# Patient Record
Sex: Male | Born: 1960
Health system: Southern US, Community
[De-identification: ages and names within clinical notes are randomized; demographics above are authoritative.]

## PROBLEM LIST (undated history)

## (undated) DIAGNOSIS — E1169 Type 2 diabetes mellitus with other specified complication: Secondary | ICD-10-CM

## (undated) DIAGNOSIS — E119 Type 2 diabetes mellitus without complications: Secondary | ICD-10-CM

## (undated) DIAGNOSIS — E781 Pure hyperglyceridemia: Secondary | ICD-10-CM

## (undated) DIAGNOSIS — M109 Gout, unspecified: Secondary | ICD-10-CM

## (undated) DIAGNOSIS — E785 Hyperlipidemia, unspecified: Secondary | ICD-10-CM

## (undated) DIAGNOSIS — K5790 Diverticulosis of intestine, part unspecified, without perforation or abscess without bleeding: Secondary | ICD-10-CM

## (undated) DIAGNOSIS — I209 Angina pectoris, unspecified: Secondary | ICD-10-CM

## (undated) DIAGNOSIS — I2510 Atherosclerotic heart disease of native coronary artery without angina pectoris: Secondary | ICD-10-CM

## (undated) DIAGNOSIS — Z8719 Personal history of other diseases of the digestive system: Secondary | ICD-10-CM

## (undated) DIAGNOSIS — I70213 Atherosclerosis of native arteries of extremities with intermittent claudication, bilateral legs: Secondary | ICD-10-CM

## (undated) HISTORY — DX: Hyperlipidemia, unspecified: E78.5

## (undated) HISTORY — PX: FOOT FRACTURE SURGERY: SHX645

## (undated) HISTORY — DX: Gout, unspecified: M10.9

## (undated) HISTORY — DX: Type 2 diabetes mellitus with other specified complication: E11.69

## (undated) HISTORY — DX: Atherosclerosis of native arteries of extremities with intermittent claudication, bilateral legs: I70.213

## (undated) HISTORY — DX: Type 2 diabetes mellitus without complications: E11.9

## (undated) HISTORY — DX: Pure hyperglyceridemia: E78.1

## (undated) HISTORY — DX: Diverticulosis of intestine, part unspecified, without perforation or abscess without bleeding: K57.90

## (undated) HISTORY — PX: COLONOSCOPY: SHX174

---

## 2016-08-29 DIAGNOSIS — E119 Type 2 diabetes mellitus without complications: Secondary | ICD-10-CM | POA: Diagnosis not present

## 2016-08-29 DIAGNOSIS — E7800 Pure hypercholesterolemia, unspecified: Secondary | ICD-10-CM | POA: Diagnosis not present

## 2016-08-29 DIAGNOSIS — M654 Radial styloid tenosynovitis [de Quervain]: Secondary | ICD-10-CM | POA: Diagnosis not present

## 2016-10-25 DIAGNOSIS — Z125 Encounter for screening for malignant neoplasm of prostate: Secondary | ICD-10-CM | POA: Diagnosis not present

## 2016-10-25 DIAGNOSIS — Z0000 Encounter for general adult medical examination without abnormal findings: Secondary | ICD-10-CM | POA: Diagnosis not present

## 2016-11-01 DIAGNOSIS — Z23 Encounter for immunization: Secondary | ICD-10-CM | POA: Diagnosis not present

## 2016-11-01 DIAGNOSIS — Z0001 Encounter for general adult medical examination with abnormal findings: Secondary | ICD-10-CM | POA: Diagnosis not present

## 2016-11-07 DIAGNOSIS — N3001 Acute cystitis with hematuria: Secondary | ICD-10-CM | POA: Diagnosis not present

## 2016-11-07 DIAGNOSIS — R310 Gross hematuria: Secondary | ICD-10-CM | POA: Diagnosis not present

## 2016-11-13 DIAGNOSIS — N201 Calculus of ureter: Secondary | ICD-10-CM | POA: Diagnosis not present

## 2016-11-16 DIAGNOSIS — E782 Mixed hyperlipidemia: Secondary | ICD-10-CM | POA: Diagnosis not present

## 2016-11-16 DIAGNOSIS — E119 Type 2 diabetes mellitus without complications: Secondary | ICD-10-CM | POA: Diagnosis not present

## 2016-11-22 DIAGNOSIS — R310 Gross hematuria: Secondary | ICD-10-CM | POA: Diagnosis not present

## 2016-11-24 DIAGNOSIS — N201 Calculus of ureter: Secondary | ICD-10-CM | POA: Diagnosis not present

## 2016-11-27 DIAGNOSIS — N201 Calculus of ureter: Secondary | ICD-10-CM | POA: Diagnosis not present

## 2016-12-06 DIAGNOSIS — N3021 Other chronic cystitis with hematuria: Secondary | ICD-10-CM | POA: Diagnosis not present

## 2016-12-06 DIAGNOSIS — R310 Gross hematuria: Secondary | ICD-10-CM | POA: Diagnosis not present

## 2017-02-20 DIAGNOSIS — L821 Other seborrheic keratosis: Secondary | ICD-10-CM | POA: Diagnosis not present

## 2017-02-20 DIAGNOSIS — L719 Rosacea, unspecified: Secondary | ICD-10-CM | POA: Diagnosis not present

## 2017-02-20 DIAGNOSIS — C4442 Squamous cell carcinoma of skin of scalp and neck: Secondary | ICD-10-CM | POA: Diagnosis not present

## 2017-03-01 DIAGNOSIS — K047 Periapical abscess without sinus: Secondary | ICD-10-CM | POA: Diagnosis not present

## 2017-03-01 DIAGNOSIS — R51 Headache: Secondary | ICD-10-CM | POA: Diagnosis not present

## 2017-11-29 DIAGNOSIS — E119 Type 2 diabetes mellitus without complications: Secondary | ICD-10-CM | POA: Diagnosis not present

## 2017-12-24 DIAGNOSIS — N3021 Other chronic cystitis with hematuria: Secondary | ICD-10-CM | POA: Diagnosis not present

## 2017-12-24 DIAGNOSIS — R310 Gross hematuria: Secondary | ICD-10-CM | POA: Diagnosis not present

## 2018-01-24 DIAGNOSIS — E119 Type 2 diabetes mellitus without complications: Secondary | ICD-10-CM | POA: Diagnosis not present

## 2018-01-24 DIAGNOSIS — Z125 Encounter for screening for malignant neoplasm of prostate: Secondary | ICD-10-CM | POA: Diagnosis not present

## 2018-01-24 DIAGNOSIS — Z0001 Encounter for general adult medical examination with abnormal findings: Secondary | ICD-10-CM | POA: Diagnosis not present

## 2018-01-29 DIAGNOSIS — E875 Hyperkalemia: Secondary | ICD-10-CM | POA: Diagnosis not present

## 2018-01-29 DIAGNOSIS — E8779 Other fluid overload: Secondary | ICD-10-CM | POA: Diagnosis not present

## 2018-01-29 DIAGNOSIS — Z0001 Encounter for general adult medical examination with abnormal findings: Secondary | ICD-10-CM | POA: Diagnosis not present

## 2018-02-05 DIAGNOSIS — E782 Mixed hyperlipidemia: Secondary | ICD-10-CM | POA: Diagnosis not present

## 2018-02-05 DIAGNOSIS — E119 Type 2 diabetes mellitus without complications: Secondary | ICD-10-CM | POA: Diagnosis not present

## 2018-03-07 DIAGNOSIS — L719 Rosacea, unspecified: Secondary | ICD-10-CM | POA: Diagnosis not present

## 2018-03-07 DIAGNOSIS — L820 Inflamed seborrheic keratosis: Secondary | ICD-10-CM | POA: Diagnosis not present

## 2018-03-19 DIAGNOSIS — R8290 Unspecified abnormal findings in urine: Secondary | ICD-10-CM | POA: Diagnosis not present

## 2018-03-19 DIAGNOSIS — R3911 Hesitancy of micturition: Secondary | ICD-10-CM | POA: Diagnosis not present

## 2018-04-10 DIAGNOSIS — E785 Hyperlipidemia, unspecified: Secondary | ICD-10-CM | POA: Diagnosis not present

## 2018-04-10 DIAGNOSIS — E781 Pure hyperglyceridemia: Secondary | ICD-10-CM | POA: Diagnosis not present

## 2018-04-10 DIAGNOSIS — E1169 Type 2 diabetes mellitus with other specified complication: Secondary | ICD-10-CM | POA: Diagnosis not present

## 2018-04-10 DIAGNOSIS — M109 Gout, unspecified: Secondary | ICD-10-CM | POA: Diagnosis not present

## 2018-11-25 DIAGNOSIS — R29818 Other symptoms and signs involving the nervous system: Secondary | ICD-10-CM | POA: Insufficient documentation

## 2018-11-25 HISTORY — DX: Other symptoms and signs involving the nervous system: R29.818

## 2018-12-13 DIAGNOSIS — M109 Gout, unspecified: Secondary | ICD-10-CM | POA: Diagnosis not present

## 2018-12-13 DIAGNOSIS — E785 Hyperlipidemia, unspecified: Secondary | ICD-10-CM | POA: Diagnosis not present

## 2018-12-13 DIAGNOSIS — E781 Pure hyperglyceridemia: Secondary | ICD-10-CM | POA: Diagnosis not present

## 2018-12-13 DIAGNOSIS — I739 Peripheral vascular disease, unspecified: Secondary | ICD-10-CM | POA: Diagnosis not present

## 2018-12-13 DIAGNOSIS — R29818 Other symptoms and signs involving the nervous system: Secondary | ICD-10-CM | POA: Diagnosis not present

## 2018-12-13 DIAGNOSIS — E1169 Type 2 diabetes mellitus with other specified complication: Secondary | ICD-10-CM | POA: Diagnosis not present

## 2018-12-23 DIAGNOSIS — R51 Headache: Secondary | ICD-10-CM | POA: Diagnosis not present

## 2018-12-23 DIAGNOSIS — R29818 Other symptoms and signs involving the nervous system: Secondary | ICD-10-CM | POA: Diagnosis not present

## 2018-12-23 DIAGNOSIS — R202 Paresthesia of skin: Secondary | ICD-10-CM | POA: Diagnosis not present

## 2018-12-24 DIAGNOSIS — L719 Rosacea, unspecified: Secondary | ICD-10-CM | POA: Diagnosis not present

## 2018-12-24 DIAGNOSIS — C44219 Basal cell carcinoma of skin of left ear and external auricular canal: Secondary | ICD-10-CM | POA: Diagnosis not present

## 2018-12-24 DIAGNOSIS — L309 Dermatitis, unspecified: Secondary | ICD-10-CM | POA: Diagnosis not present

## 2019-01-02 DIAGNOSIS — I739 Peripheral vascular disease, unspecified: Secondary | ICD-10-CM | POA: Diagnosis not present

## 2019-01-02 DIAGNOSIS — I6523 Occlusion and stenosis of bilateral carotid arteries: Secondary | ICD-10-CM | POA: Diagnosis not present

## 2019-04-28 DIAGNOSIS — Z20828 Contact with and (suspected) exposure to other viral communicable diseases: Secondary | ICD-10-CM | POA: Diagnosis not present

## 2019-07-21 DIAGNOSIS — J329 Chronic sinusitis, unspecified: Secondary | ICD-10-CM | POA: Diagnosis not present

## 2019-07-21 DIAGNOSIS — J40 Bronchitis, not specified as acute or chronic: Secondary | ICD-10-CM | POA: Diagnosis not present

## 2019-07-21 DIAGNOSIS — Z20828 Contact with and (suspected) exposure to other viral communicable diseases: Secondary | ICD-10-CM | POA: Diagnosis not present

## 2019-12-15 DIAGNOSIS — E781 Pure hyperglyceridemia: Secondary | ICD-10-CM | POA: Diagnosis not present

## 2019-12-15 DIAGNOSIS — E785 Hyperlipidemia, unspecified: Secondary | ICD-10-CM | POA: Diagnosis not present

## 2019-12-15 DIAGNOSIS — I739 Peripheral vascular disease, unspecified: Secondary | ICD-10-CM | POA: Diagnosis not present

## 2019-12-15 DIAGNOSIS — E1169 Type 2 diabetes mellitus with other specified complication: Secondary | ICD-10-CM | POA: Diagnosis not present

## 2019-12-15 DIAGNOSIS — M109 Gout, unspecified: Secondary | ICD-10-CM | POA: Diagnosis not present

## 2020-03-09 DIAGNOSIS — E785 Hyperlipidemia, unspecified: Secondary | ICD-10-CM | POA: Diagnosis not present

## 2020-03-09 DIAGNOSIS — E1169 Type 2 diabetes mellitus with other specified complication: Secondary | ICD-10-CM | POA: Diagnosis not present

## 2020-03-10 DIAGNOSIS — Z20828 Contact with and (suspected) exposure to other viral communicable diseases: Secondary | ICD-10-CM | POA: Diagnosis not present

## 2020-03-10 DIAGNOSIS — R0789 Other chest pain: Secondary | ICD-10-CM | POA: Diagnosis not present

## 2020-03-10 DIAGNOSIS — R42 Dizziness and giddiness: Secondary | ICD-10-CM | POA: Diagnosis not present

## 2020-03-15 DIAGNOSIS — E781 Pure hyperglyceridemia: Secondary | ICD-10-CM | POA: Diagnosis not present

## 2020-03-15 DIAGNOSIS — Z23 Encounter for immunization: Secondary | ICD-10-CM | POA: Diagnosis not present

## 2020-03-15 DIAGNOSIS — E1169 Type 2 diabetes mellitus with other specified complication: Secondary | ICD-10-CM | POA: Diagnosis not present

## 2020-03-15 DIAGNOSIS — I70213 Atherosclerosis of native arteries of extremities with intermittent claudication, bilateral legs: Secondary | ICD-10-CM | POA: Diagnosis not present

## 2020-03-15 DIAGNOSIS — E785 Hyperlipidemia, unspecified: Secondary | ICD-10-CM | POA: Diagnosis not present

## 2020-04-07 DIAGNOSIS — R002 Palpitations: Secondary | ICD-10-CM | POA: Diagnosis not present

## 2020-04-07 DIAGNOSIS — E611 Iron deficiency: Secondary | ICD-10-CM | POA: Diagnosis not present

## 2020-04-07 DIAGNOSIS — I498 Other specified cardiac arrhythmias: Secondary | ICD-10-CM | POA: Diagnosis not present

## 2020-04-07 DIAGNOSIS — E538 Deficiency of other specified B group vitamins: Secondary | ICD-10-CM | POA: Diagnosis not present

## 2020-04-15 DIAGNOSIS — R002 Palpitations: Secondary | ICD-10-CM | POA: Diagnosis not present

## 2020-04-23 DIAGNOSIS — M109 Gout, unspecified: Secondary | ICD-10-CM | POA: Insufficient documentation

## 2020-04-23 DIAGNOSIS — E119 Type 2 diabetes mellitus without complications: Secondary | ICD-10-CM | POA: Insufficient documentation

## 2020-04-23 DIAGNOSIS — E785 Hyperlipidemia, unspecified: Secondary | ICD-10-CM | POA: Insufficient documentation

## 2020-04-23 DIAGNOSIS — E1169 Type 2 diabetes mellitus with other specified complication: Secondary | ICD-10-CM | POA: Insufficient documentation

## 2020-04-23 DIAGNOSIS — K5790 Diverticulosis of intestine, part unspecified, without perforation or abscess without bleeding: Secondary | ICD-10-CM | POA: Insufficient documentation

## 2020-04-23 DIAGNOSIS — I70213 Atherosclerosis of native arteries of extremities with intermittent claudication, bilateral legs: Secondary | ICD-10-CM | POA: Insufficient documentation

## 2020-05-07 ENCOUNTER — Encounter: Payer: Self-pay | Admitting: Cardiology

## 2020-05-07 ENCOUNTER — Other Ambulatory Visit: Payer: Self-pay

## 2020-05-07 ENCOUNTER — Ambulatory Visit (INDEPENDENT_AMBULATORY_CARE_PROVIDER_SITE_OTHER): Payer: BC Managed Care – PPO

## 2020-05-07 ENCOUNTER — Ambulatory Visit: Payer: BC Managed Care – PPO | Admitting: Cardiology

## 2020-05-07 VITALS — BP 120/80 | HR 72 | Ht 69.0 in | Wt 177.8 lb

## 2020-05-07 DIAGNOSIS — E782 Mixed hyperlipidemia: Secondary | ICD-10-CM

## 2020-05-07 DIAGNOSIS — R42 Dizziness and giddiness: Secondary | ICD-10-CM

## 2020-05-07 DIAGNOSIS — R0789 Other chest pain: Secondary | ICD-10-CM

## 2020-05-07 DIAGNOSIS — E781 Pure hyperglyceridemia: Secondary | ICD-10-CM

## 2020-05-07 DIAGNOSIS — E118 Type 2 diabetes mellitus with unspecified complications: Secondary | ICD-10-CM

## 2020-05-07 HISTORY — DX: Pure hyperglyceridemia: E78.1

## 2020-05-07 HISTORY — DX: Mixed hyperlipidemia: E78.2

## 2020-05-07 HISTORY — DX: Dizziness and giddiness: R42

## 2020-05-07 HISTORY — DX: Other chest pain: R07.89

## 2020-05-07 MED ORDER — METOPROLOL TARTRATE 100 MG PO TABS: 100.0000 mg | ORAL_TABLET | ORAL | 0 refills | Status: DC

## 2020-05-07 NOTE — Patient Instructions (Addendum)
Medication Instructions:  Your physician recommends that you continue on your current medications as directed. Please refer to the Current Medication list given to you today.  *If you need a refill on your cardiac medications before your next appointment, please call your pharmacy*   Lab Work: Your physician recommends that you return for lab work 1 week before your CT   If you have labs (blood work) drawn today and your tests are completely normal, you will receive your results only by: Marland Kitchen MyChart Message (if you have MyChart) OR . A paper copy in the mail If you have any lab test that is abnormal or we need to change your treatment, we will call you to review the results.   Testing/Procedures: Your physician has ordered for you to have a Cardiac CTA at [facility] [place] zio monitor was ordered today. It will remain on for 14 days. You will then return monitor and event diary in provided box. It takes 1-2 weeks for report to be downloaded and returned to [place]. We will call you with the results. If monitor falls off or has orange flashing light, please call Zio for further instructions.    Follow-Up: At [facility], you and your health needs are our priority.  As part of our continuing mission to provide you with exceptional heart care, we have created designated Provider Care Teams.  These Care Teams include your primary Cardiologist (physician) and Advanced Practice Providers (APPs -  Physician Assistants and Nurse Practitioners) who all work together to provide you with the care you need, when you need it.  We recommend signing up for the patient portal called "MyChart".  Sign up information is provided on this After Visit Summary.  MyChart is used to connect with patients for Virtual Visits (Telemedicine).  Patients are able to view lab/test results, encounter notes, upcoming appointments, etc.  Non-urgent messages can be sent to your provider as well.   To learn more  about what you can do with MyChart, go to [email or web address].    Your next appointment:   3 month(s)  The format for your next appointment:   In Person  Provider:   Berniece Salines, DO   Other Instructions Your cardiac CT will be scheduled at one of the below locations:   [facility] [address] [place], [place] [address] [phone]  If scheduled at [facility], please arrive at the [facility] main entrance of [facility] 30 minutes prior to test start time. Proceed to the [facility] Radiology Department (first floor) to check-in and test prep.  Please follow these instructions carefully (unless otherwise directed):  Hold all erectile dysfunction medications at least 3 days (72 hrs) prior to test.  On the Night Before the Test: . Be sure to Drink plenty of water. . Do not consume any caffeinated/decaffeinated beverages or chocolate 12 hours prior to your test. . Do not take any antihistamines 12 hours prior to your test.  On the Day of the Test: . Drink plenty of water. Do not drink any water within one hour of the test. . Do not eat any food 4 hours prior to the test. . You may take your regular medications prior to the test.  . Take metoprolol (Lopressor) two hours prior to test.       After the Test: . Drink plenty of water. . After receiving IV contrast, you may experience a mild flushed feeling. This is  normal. . On occasion, you may experience a mild rash up to 24 hours after the test. This is not dangerous. If this occurs, you can take Benadryl 25 mg and increase your fluid intake. . If you experience trouble breathing, this can be serious. If it is severe call 911 IMMEDIATELY. If it is mild, please call our office. . If you take any of these medications: Glipizide/Metformin, Avandament, Glucavance, please do not take 48 hours after completing test unless otherwise instructed.   Once we have confirmed authorization from your  insurance company, we will call you to set up a date and time for your test. Based on how quickly your insurance processes prior authorizations requests, please allow up to 4 weeks to be contacted for scheduling your Cardiac CT appointment. Be advised that routine Cardiac CT appointments could be scheduled as many as 8 weeks after your provider has ordered it.  For non-scheduling related questions, please contact the cardiac imaging nurse navigator should you have any questions/concerns: Marchia Bond, Cardiac Imaging Nurse Navigator Burley Saver, Interim Cardiac Imaging Nurse [place] and Vascular Services Direct Office Dial: [phone]   For scheduling needs, including cancellations and rescheduling, please call [place], [phone] (temporary number).

## 2020-05-07 NOTE — Progress Notes (Signed)
Cardiology Office Note:    Date:  05/07/2020   ID:  Janace Hoard, DOB 1960/08/05, MRN [id number]  PCP:  Street, Sharon Mt, MD  Cardiologist:  Berniece Salines, DO  Electrophysiologist:  None   Referring MD: Street, Sharon Mt, *   I am experiencing dizziness  History of Present Illness:    ADRIENE PADULA is a 59 y.o. male with a hx of dyslipidemia, type 2 diabetes, history of hypertriglyceridemia presents today to be evaluated for dizziness and chest pain.  Patient tells me that he has been experiencing dizziness for several months now.  Initially he went to urgent care at which time he saw under the physicians there told him that his testing was negative and he was advised that he may not have this experience again.  It did not last time he started again having some more dizziness.  He called his PCP and noted that the dizziness is still persistent.  He was sent for an echocardiogram at [facility] his echocardiogram showed evidence of impaired relaxation the patient was called that he needed to see cardiology.  Today he tells me that the dizziness sometimes is intermittent abrupt and last for few minutes and sometimes hours.  He notes that the occurrence has lessened since his wife started giving him B12.  In addition he talks about intermittent chest pain.  He states his last episode of chest pain was about 3 weeks ago he described as a sharp pain which does not radiate last for seconds and then resolved.  No other complaints at this time.  Past Medical History:  Diagnosis Date  . Atherosclerosis of native artery of both lower extremities with intermittent claudication ([place])   . Diverticulosis   . Dyslipidemia   . Gouty arthropathy   . Transient neurologic deficit 11/2018  . Type 2 diabetes mellitus with hypertriglyceridemia [facility])     Past Surgical History:  Procedure Laterality Date  . FOOT FRACTURE SURGERY Left     Current Medications: Current Meds  Medication Sig  .  atorvastatin (LIPITOR) 40 MG tablet Take 40 mg by mouth daily.  Marland Kitchen ezetimibe (ZETIA) 10 MG tablet Take 10 mg by mouth daily.  . metFORMIN (GLUCOPHAGE) 1000 MG tablet Take 1,000 mg by mouth 2 (two) times daily with a meal.  . Semaglutide,0.25 or 0.5MG/DOS, (OZEMPIC, 0.25 OR 0.5 MG/DOSE,) 2 MG/1.5ML SOPN Inject into the skin once a week.     Allergies:   Patient has no known allergies.   Social History   Socioeconomic History  . Marital status: Unknown    Spouse name: Not on file  . Number of children: Not on file  . Years of education: Not on file  . Highest education level: Not on file  Occupational History  . Not on file  Tobacco Use  . Smoking status: Never Smoker  . Smokeless tobacco: Never Used  Substance and Sexual Activity  . Alcohol use: Not Currently  . Drug use: Not on file  . Sexual activity: Not on file  Other Topics Concern  . Not on file  Social History Narrative  . Not on file   Social Determinants of Health   Financial Resource Strain:   . Difficulty of Paying Living Expenses: Not on file  Food Insecurity:   . Worried About Charity fundraiser in the Last Year: Not on file  . Ran Out of Food in the Last Year: Not on file  Transportation Needs:   . Lack of  Transportation (Medical): Not on file  . Lack of Transportation (Non-Medical): Not on file  Physical Activity:   . Days of Exercise per Week: Not on file  . Minutes of Exercise per Session: Not on file  Stress:   . Feeling of Stress : Not on file  Social Connections:   . Frequency of Communication with Friends and Family: Not on file  . Frequency of Social Gatherings with Friends and Family: Not on file  . Attends Religious Services: Not on file  . Active Member of Clubs or Organizations: Not on file  . Attends Archivist Meetings: Not on file  . Marital Status: Not on file     Family History: The patient's family history includes Alzheimer's disease in his mother; Colon polyps in his  father; Diabetes in his mother.  ROS:   Review of Systems  Constitution: Negative for decreased appetite, fever and weight gain.  HENT: Negative for congestion, ear discharge, hoarse voice and sore throat.   Eyes: Negative for discharge, redness, vision loss in right eye and visual halos.  Cardiovascular: Negative for chest pain, dyspnea on exertion, leg swelling, orthopnea and palpitations.  Respiratory: Negative for cough, hemoptysis, shortness of breath and snoring.   Endocrine: Negative for heat intolerance and polyphagia.  Hematologic/Lymphatic: Negative for bleeding problem. Does not bruise/bleed easily.  Skin: Negative for flushing, nail changes, rash and suspicious lesions.  Musculoskeletal: Negative for arthritis, joint pain, muscle cramps, myalgias, neck pain and stiffness.  Gastrointestinal: Negative for abdominal pain, bowel incontinence, diarrhea and excessive appetite.  Genitourinary: Negative for decreased libido, genital sores and incomplete emptying.  Neurological: Negative for brief paralysis, focal weakness, headaches and loss of balance.  Psychiatric/Behavioral: Negative for altered mental status, depression and suicidal ideas.  Allergic/Immunologic: Negative for HIV exposure and persistent infections.    EKGs/Labs/Other Studies Reviewed:    The following studies were reviewed today:   EKG:  The ekg ordered today demonstrates sinus rhythm, heart rate 80 bpm.  Echocardiogram done at [facility] on April 15, 2020 shows mild concentric left ventricular hypertrophy.  EF 60 to 65%.  Grade 1 diastolic dysfunction indicating impaired relaxation.  Right ventricle is normal size.  Left atrium normal size.  Trace amount of aortic regurgitation.  Trace mitral regurgitation.  Trace tricuspid regurgitation.  Aortic root, ascending aorta and aortic arch are appears to be normal.  Recent Labs: No results found for requested labs within last 8760 hours.  Recent Lipid  Panel No results found for: CHOL, TRIG, HDL, CHOLHDL, VLDL, LDLCALC, LDLDIRECT  Physical Exam:    VS:  BP 120/80 (BP Location: Right Arm)   Pulse 72   Ht 5' 9"  (1.753 m)   Wt 177 lb 12.8 oz (80.6 kg)   SpO2 95%   BMI 26.26 kg/m     Wt Readings from Last 3 Encounters:  05/07/20 177 lb 12.8 oz (80.6 kg)     GEN: Well nourished, well developed in no acute distress HEENT: Normal NECK: No JVD; No carotid bruits LYMPHATICS: No lymphadenopathy CARDIAC: S1S2 noted,RRR, no murmurs, rubs, gallops RESPIRATORY:  Clear to auscultation without rales, wheezing or rhonchi  ABDOMEN: Soft, non-tender, non-distended, +bowel sounds, no guarding. EXTREMITIES: No edema, No cyanosis, no clubbing MUSCULOSKELETAL:  No deformity  SKIN: Warm and dry NEUROLOGIC:  Alert and oriented x 3, non-focal PSYCHIATRIC:  Normal affect, good insight  ASSESSMENT:    1. Dizziness   2. Other chest pain   3. Type 2 diabetes mellitus with complication, without  long-term current use of insulin ([place])   4. Hypertriglyceridemia   5. Mixed hyperlipidemia    PLAN:     1.  I would like to rule out a cardiovascular etiology of this dizziness, therefore at this time I would like to placed a zio patch for 14 days. I I reviewed the patient echocardiogram does not show any evidence of structural abnormalities.  I explained to the patient what it means to have grade 1 diastolic dysfunction.  Expresses understanding. In terms of his chest pain he does have intermediate risk factors age, hyperlipidemia as well as diabetes mellitus.  I am suspecting this may be his anginal equivalent.the pain is atypical.  We have talked about moving forward with an ischemic evaluation he is agreeable.  A cardiac CTA will be ordered for this patient.  He has no IV contrast dye allergy.  He is agreeable to proceed with this testing.  Hyperlipidemia continue patient atorvastatin and Zetia. Hypertriglyceridemia it appears that his numbers has improved  he tells me that he started with numbers in the 2000s and his most recent lipid profile shows his triglyceride was 289, his LDL was 45 HDL was 33 and total cholesterol 136. Diabetes per his PCP.  His recent hemoglobin A1c was 7.2.   For now, I do reccomend that the patient goes to the nearest ED if  symptoms recur.  The patient is in agreement with the above plan. The patient left the office in stable condition.  The patient will follow up in 3 months or sooner if needed.   Medication Adjustments/Labs and Tests Ordered: Current medicines are reviewed at length with the patient today.  Concerns regarding medicines are outlined above.  Orders Placed This Encounter  Procedures  . CT CORONARY MORPH W/CTA COR W/SCORE W/CA W/CM &/OR WO/CM  . CT CORONARY FRACTIONAL FLOW RESERVE DATA PREP  . CT CORONARY FRACTIONAL FLOW RESERVE FLUID ANALYSIS  . Basic metabolic panel  . LONG TERM MONITOR (3-14 DAYS)  . EKG 12-Lead   Meds ordered this encounter  Medications  . metoprolol tartrate (LOPRESSOR) 100 MG tablet    Sig: Take 1 tablet (100 mg total) by mouth as directed. Take 1 tablet by mouth 2 hours before CT    Dispense:  1 tablet    Refill:  0    Patient Instructions  Medication Instructions:  Your physician recommends that you continue on your current medications as directed. Please refer to the Current Medication list given to you today.  *If you need a refill on your cardiac medications before your next appointment, please call your pharmacy*   Lab Work: Your physician recommends that you return for lab work 1 week before your CT   If you have labs (blood work) drawn today and your tests are completely normal, you will receive your results only by: Marland Kitchen MyChart Message (if you have MyChart) OR . A paper copy in the mail If you have any lab test that is abnormal or we need to change your treatment, we will call you to review the results.   Testing/Procedures: Your physician has ordered  for you to have a Cardiac CTA at [facility] in [place] zio monitor was ordered today. It will remain on for 14 days. You will then return monitor and event diary in provided box. It takes 1-2 weeks for report to be downloaded and returned to [place]. We will call you with the results. If monitor falls off or has orange  flashing light, please call Zio for further instructions.    Follow-Up: At [facility], you and your health needs are our priority.  As part of our continuing mission to provide you with exceptional heart care, we have created designated Provider Care Teams.  These Care Teams include your primary Cardiologist (physician) and Advanced Practice Providers (APPs -  Physician Assistants and Nurse Practitioners) who all work together to provide you with the care you need, when you need it.  We recommend signing up for the patient portal called "MyChart".  Sign up information is provided on this After Visit Summary.  MyChart is used to connect with patients for Virtual Visits (Telemedicine).  Patients are able to view lab/test results, encounter notes, upcoming appointments, etc.  Non-urgent messages can be sent to your provider as well.   To learn more about what you can do with MyChart, go to [email or web address].    Your next appointment:   3 month(s)  The format for your next appointment:   In Person  Provider:   Berniece Salines, DO   Other Instructions Your cardiac CT will be scheduled at one of the below locations:   [facility] [address] [place], [place] [address] [phone]  If scheduled at [facility], please arrive at the [facility] main entrance of [facility] 30 minutes prior to test start time. Proceed to the [facility] Radiology Department (first floor) to check-in and test prep.  Please follow these instructions carefully (unless otherwise directed):  Hold all erectile dysfunction medications at least 3 days  (72 hrs) prior to test.  On the Night Before the Test: . Be sure to Drink plenty of water. . Do not consume any caffeinated/decaffeinated beverages or chocolate 12 hours prior to your test. . Do not take any antihistamines 12 hours prior to your test.  On the Day of the Test: . Drink plenty of water. Do not drink any water within one hour of the test. . Do not eat any food 4 hours prior to the test. . You may take your regular medications prior to the test.  . Take metoprolol (Lopressor) two hours prior to test.       After the Test: . Drink plenty of water. . After receiving IV contrast, you may experience a mild flushed feeling. This is normal. . On occasion, you may experience a mild rash up to 24 hours after the test. This is not dangerous. If this occurs, you can take Benadryl 25 mg and increase your fluid intake. . If you experience trouble breathing, this can be serious. If it is severe call 911 IMMEDIATELY. If it is mild, please call our office. . If you take any of these medications: Glipizide/Metformin, Avandament, Glucavance, please do not take 48 hours after completing test unless otherwise instructed.   Once we have confirmed authorization from your insurance company, we will call you to set up a date and time for your test. Based on how quickly your insurance processes prior authorizations requests, please allow up to 4 weeks to be contacted for scheduling your Cardiac CT appointment. Be advised that routine Cardiac CT appointments could be scheduled as many as 8 weeks after your provider has ordered it.  For non-scheduling related questions, please contact the cardiac imaging nurse navigator should you have any questions/concerns: Marchia Bond, Cardiac Imaging Nurse Navigator Burley Saver, Interim Cardiac Imaging Nurse [place] and Vascular Services Direct Office Dial: [phone]   For  scheduling needs, including cancellations and rescheduling, please  call [place], [phone] (temporary number).       Adopting a Healthy Lifestyle.  Know what a healthy weight is for you (roughly BMI <25) and aim to maintain this   Aim for 7+ servings of fruits and vegetables daily   65-80+ fluid ounces of water or unsweet tea for healthy kidneys   Limit to max 1 drink of alcohol per day; avoid smoking/tobacco   Limit animal fats in diet for cholesterol and heart health - choose grass fed whenever available   Avoid highly processed foods, and foods high in saturated/trans fats   Aim for low stress - take time to unwind and care for your mental health   Aim for 150 min of moderate intensity exercise weekly for heart health, and weights twice weekly for bone health   Aim for 7-9 hours of sleep daily   When it comes to diets, agreement about the perfect plan isnt easy to find, even among the experts. Experts at the [employer] developed an idea known as the Healthy Eating Plate. Just imagine a plate divided into logical, healthy portions.   The emphasis is on diet quality:   Load up on vegetables and fruits - one-half of your plate: Aim for color and variety, and remember that potatoes dont count.   Go for whole grains - one-quarter of your plate: Whole wheat, barley, wheat berries, quinoa, oats, brown rice, and foods made with them. If you want pasta, go with whole wheat pasta.   Protein power - one-quarter of your plate: Fish, chicken, beans, and nuts are all healthy, versatile protein sources. Limit red meat.   The diet, however, does go beyond the plate, offering a few other suggestions.   Use healthy plant oils, such as olive, canola, soy, corn, sunflower and peanut. Check the labels, and avoid partially hydrogenated oil, which have unhealthy trans fats.   If youre thirsty, drink water. Coffee and tea are good in moderation, but skip sugary drinks and limit milk and dairy products to one or two daily servings.    The type of carbohydrate in the diet is more important than the amount. Some sources of carbohydrates, such as vegetables, fruits, whole grains, and beans-are healthier than others.   Finally, stay active  Signed, Berniece Salines, DO  05/07/2020 3:29 PM    [place]

## 2020-05-24 DIAGNOSIS — Z79899 Other long term (current) drug therapy: Secondary | ICD-10-CM | POA: Diagnosis not present

## 2020-05-31 ENCOUNTER — Telehealth: Payer: Self-pay | Admitting: Emergency Medicine

## 2020-05-31 NOTE — Telephone Encounter (Signed)
Patient was advised of below by DOD Recommendation by Dr. Bing Matter.

## 2020-05-31 NOTE — Telephone Encounter (Signed)
Left message for patient to return call. He needs to go to emergency room most likely due to left arm pain that radiates into neck.    Got a hold of the patient. Informed him that he is recommended to go be seen in the emergency room to be evaluated based on symptoms. He is hesitant to do this. He will talk with his wife and decide what to do. He is aware our recommendation is to go to emergency room.

## 2020-06-01 ENCOUNTER — Telehealth (HOSPITAL_COMMUNITY): Payer: Self-pay | Admitting: *Deleted

## 2020-06-01 NOTE — Telephone Encounter (Signed)
Reaching out to patient to offer assistance regarding upcoming cardiac imaging study; pt verbalizes understanding of appt date/time.  Pt requesting change in appointment due to insurance issues.  Pt rescheduled for January on 2022.  Pt verbalized understanding of need to obtain blood work prior to scan.  Pt confirms that he has the metoprolol in his possession.   Burley Saver RN Navigator Cardiac Imaging [facility] [phone] office [phone] cell

## 2020-06-03 ENCOUNTER — Ambulatory Visit (HOSPITAL_COMMUNITY): Admission: RE | Admit: 2020-06-03 | Payer: BC Managed Care – PPO | Source: Ambulatory Visit

## 2020-06-09 ENCOUNTER — Other Ambulatory Visit: Payer: Self-pay

## 2020-06-09 DIAGNOSIS — R748 Abnormal levels of other serum enzymes: Secondary | ICD-10-CM | POA: Insufficient documentation

## 2020-06-09 DIAGNOSIS — Z0001 Encounter for general adult medical examination with abnormal findings: Secondary | ICD-10-CM

## 2020-06-09 DIAGNOSIS — N209 Urinary calculus, unspecified: Secondary | ICD-10-CM

## 2020-06-09 DIAGNOSIS — K760 Fatty (change of) liver, not elsewhere classified: Secondary | ICD-10-CM

## 2020-06-09 DIAGNOSIS — E1169 Type 2 diabetes mellitus with other specified complication: Secondary | ICD-10-CM | POA: Insufficient documentation

## 2020-06-09 DIAGNOSIS — F411 Generalized anxiety disorder: Secondary | ICD-10-CM | POA: Insufficient documentation

## 2020-06-09 HISTORY — DX: Generalized anxiety disorder: F41.1

## 2020-06-09 HISTORY — DX: Urinary calculus, unspecified: N20.9

## 2020-06-09 HISTORY — DX: Fatty (change of) liver, not elsewhere classified: K76.0

## 2020-06-09 HISTORY — DX: Abnormal levels of other serum enzymes: R74.8

## 2020-06-09 HISTORY — DX: Encounter for general adult medical examination with abnormal findings: Z00.01

## 2020-06-10 ENCOUNTER — Ambulatory Visit: Payer: BC Managed Care – PPO | Admitting: Cardiology

## 2020-06-10 ENCOUNTER — Encounter: Payer: Self-pay | Admitting: Cardiology

## 2020-06-10 ENCOUNTER — Other Ambulatory Visit: Payer: Self-pay

## 2020-06-10 VITALS — BP 122/88 | HR 69 | Ht 69.0 in | Wt 179.2 lb

## 2020-06-10 DIAGNOSIS — E1169 Type 2 diabetes mellitus with other specified complication: Secondary | ICD-10-CM

## 2020-06-10 DIAGNOSIS — I472 Ventricular tachycardia: Secondary | ICD-10-CM

## 2020-06-10 DIAGNOSIS — E785 Hyperlipidemia, unspecified: Secondary | ICD-10-CM

## 2020-06-10 DIAGNOSIS — I10 Essential (primary) hypertension: Secondary | ICD-10-CM | POA: Diagnosis not present

## 2020-06-10 DIAGNOSIS — I4719 Other supraventricular tachycardia: Secondary | ICD-10-CM | POA: Insufficient documentation

## 2020-06-10 DIAGNOSIS — E781 Pure hyperglyceridemia: Secondary | ICD-10-CM

## 2020-06-10 DIAGNOSIS — I471 Supraventricular tachycardia: Secondary | ICD-10-CM

## 2020-06-10 DIAGNOSIS — I4729 Other ventricular tachycardia: Secondary | ICD-10-CM | POA: Insufficient documentation

## 2020-06-10 MED ORDER — METOPROLOL SUCCINATE ER 25 MG PO TB24: 12.5000 mg | ORAL_TABLET | Freq: Every day | ORAL | 3 refills | Status: DC

## 2020-06-10 NOTE — Patient Instructions (Addendum)
Medication Instructions:  Your physician has recommended you make the following change in your medication:   Start Toprol XL 12.5 mg at night.  *If you need a refill on your cardiac medications before your next appointment, please call your pharmacy*   Lab Work: None ordered If you have labs (blood work) drawn today and your tests are completely normal, you will receive your results only by: Marland Kitchen MyChart Message (if you have MyChart) OR . A paper copy in the mail If you have any lab test that is abnormal or we need to change your treatment, we will call you to review the results.   Testing/Procedures: None ordered   Follow-Up: At [facility], you and your health needs are our priority.  As part of our continuing mission to provide you with exceptional heart care, we have created designated Provider Care Teams.  These Care Teams include your primary Cardiologist (physician) and Advanced Practice Providers (APPs -  Physician Assistants and Nurse Practitioners) who all work together to provide you with the care you need, when you need it.  We recommend signing up for the patient portal called "MyChart".  Sign up information is provided on this After Visit Summary.  MyChart is used to connect with patients for Virtual Visits (Telemedicine).  Patients are able to view lab/test results, encounter notes, upcoming appointments, etc.  Non-urgent messages can be sent to your provider as well.   To learn more about what you can do with MyChart, go to [email or web address].    Your next appointment:   3 month(s)  The format for your next appointment:   In Person  Provider:   Belva Crome, MD   Other Instructions Metoprolol Extended-Release Tablets What is this medicine? METOPROLOL (me TOE proe lole) is a beta blocker. It decreases the amount of work your heart has to do and helps your heart beat regularly. It treats high blood pressure and/or prevent chest pain (also called angina). It  also treats heart failure. This medicine may be used for other purposes; ask your health care provider or pharmacist if you have questions. COMMON BRAND NAME(S): toprol, Toprol XL What should I tell my health care provider before I take this medicine? They need to know if you have any of these conditions:  diabetes  heart or vessel disease like slow heart rate, worsening heart failure, heart block, sick sinus syndrome or Raynaud's disease  kidney disease  liver disease  lung or breathing disease, like asthma or emphysema  pheochromocytoma  thyroid disease  an unusual or allergic reaction to metoprolol, other beta-blockers, medicines, foods, dyes, or preservatives  pregnant or trying to get pregnant  breast-feeding How should I use this medicine? Take this drug by mouth. Take it as directed on the prescription label at the same time every day. Take it with food. You may cut the tablet in half if it is scored (has a line in the middle of it). This may help you swallow the tablet if the whole tablet is too big. Be sure to take both halves. Do not take just one-half of the tablet. Keep taking it unless your health care provider tells you to stop. Talk to your health care provider about the use of this drug in children. While it may be prescribed for children as young as 6 for selected conditions, precautions do apply. Overdosage: If you think you have taken too much of this medicine contact a poison control center or emergency room at once. NOTE: This medicine  is only for you. Do not share this medicine with others. What if I miss a dose? If you miss a dose, take it as soon as you can. If it is almost time for your next dose, take only that dose. Do not take double or extra doses. What may interact with this medicine? This medicine may interact with the following medications:  certain medicines for blood pressure, heart disease, irregular heart beat  certain medicines for depression,  like monoamine oxidase (MAO) inhibitors, fluoxetine, or paroxetine  clonidine  dobutamine  epinephrine  isoproterenol  reserpine This list may not describe all possible interactions. Give your health care provider a list of all the medicines, herbs, non-prescription drugs, or dietary supplements you use. Also tell them if you smoke, drink alcohol, or use illegal drugs. Some items may interact with your medicine. What should I watch for while using this medicine? Visit your doctor or health care professional for regular check ups. Contact your doctor right away if your symptoms worsen. Check your blood pressure and pulse rate regularly. Ask your health care professional what your blood pressure and pulse rate should be, and when you should contact them. You may get drowsy or dizzy. Do not drive, use machinery, or do anything that needs mental alertness until you know how this medicine affects you. Do not sit or stand up quickly, especially if you are an older patient. This reduces the risk of dizzy or fainting spells. Contact your doctor if these symptoms continue. Alcohol may interfere with the effect of this medicine. Avoid alcoholic drinks. This medicine may increase blood sugar. Ask your healthcare provider if changes in diet or medicines are needed if you have diabetes. What side effects may I notice from receiving this medicine? Side effects that you should report to your doctor or health care professional as soon as possible:  allergic reactions like skin rash, itching or hives  cold or numb hands or feet  depression  difficulty breathing  faint  fever with sore throat  irregular heartbeat, chest pain  rapid weight gain   signs and symptoms of high blood sugar such as being more thirsty or hungry or having to urinate more than normal. You may also feel very tired or have blurry vision.  swollen legs or ankles Side effects that usually do not require medical attention  (report to your doctor or health care professional if they continue or are bothersome):  anxiety or nervousness  change in sex drive or performance  dry skin  headache  nightmares or trouble sleeping  short term memory loss  stomach upset or diarrhea This list may not describe all possible side effects. Call your doctor for medical advice about side effects. You may report side effects to FDA at 1-800-FDA-1088. Where should I keep my medicine? Keep out of the reach of children and pets. Store at room temperature between 20 and 25 degrees C (68 and 77 degrees F). Throw away any unused drug after the expiration date. NOTE: This sheet is a summary. It may not cover all possible information. If you have questions about this medicine, talk to your doctor, pharmacist, or health care provider.  2020 Elsevier/Gold Standard (2019-01-23 18:23:00)

## 2020-06-10 NOTE — Progress Notes (Signed)
Cardiology Office Note:    Date:  06/10/2020   ID:  [name], DOB [birth year], MRN [id number]  PCP:  Street, Stephanie Coup, MD  Cardiologist:  Thomasene Ripple, DO  Electrophysiologist:  None   Referring MD: Street, Stephanie Coup, *   " I am doing well"  History of Present Illness:    Richard Lane is a 59 y.o. male with a hx of dyslipidemia, type 2 diabetes, history of hypertriglyceridemia presented on May 07, 2020 to be evaluated for dizziness and chest pain.  During his initial visit we will place a monitor on the patient to assess for any arrhythmias.  I also recommended he undergo coronary CTA to rule out coronary artery disease.  He is here today to discuss his monitor results.  His CT scan is this pending.  Past Medical History:  Diagnosis Date  . Alkaline phosphatase raised 06/09/2020  . Atherosclerosis of native artery of both lower extremities with intermittent claudication (HCC)   . Diverticulosis   . Dizziness 05/07/2020  . Dyslipidemia   . Encounter for general adult medical examination with abnormal findings 06/09/2020  . Fatty liver 06/09/2020  . Generalized anxiety disorder 06/09/2020  . Gouty arthropathy   . Hypertriglyceridemia 05/07/2020  . Mixed hyperlipidemia 05/07/2020  . Other chest pain 05/07/2020  . Transient neurologic deficit 11/2018  . Type 2 diabetes mellitus with hypertriglyceridemia (HCC)   . Type 2 diabetes mellitus without complication (HCC)   . Rochele Pages 06/09/2020    Past Surgical History:  Procedure Laterality Date  . FOOT FRACTURE SURGERY Left     Current Medications: Current Meds  Medication Sig  . allopurinol (ZYLOPRIM) 300 MG tablet Take 300 mg by mouth daily.  Marland Kitchen atorvastatin (LIPITOR) 40 MG tablet Take 40 mg by mouth daily.  . Dulaglutide (TRULICITY) 0.75 MG/0.5ML SOPN once a week.  . ezetimibe (ZETIA) 10 MG tablet Take 10 mg by mouth daily.  Boris Lown Oil 500 MG CAPS 500 mg See admin instructions.  . metFORMIN (GLUCOPHAGE)  1000 MG tablet Take 1,000 mg by mouth 2 (two) times daily with a meal.  . metoprolol tartrate (LOPRESSOR) 100 MG tablet Take 1 tablet (100 mg total) by mouth as directed. Take 1 tablet by mouth 2 hours before CT  . Semaglutide,0.25 or 0.5MG /DOS, (OZEMPIC, 0.25 OR 0.5 MG/DOSE,) 2 MG/1.5ML SOPN Inject into the skin once a week.     Allergies:   Patient has no known allergies.   Social History   Socioeconomic History  . Marital status: Unknown    Spouse name: Not on file  . Number of children: Not on file  . Years of education: Not on file  . Highest education level: Not on file  Occupational History  . Not on file  Tobacco Use  . Smoking status: Former Games developer  . Smokeless tobacco: Never Used  . Tobacco comment: QUIT SMOKING 24 YR AGO  Substance and Sexual Activity  . Alcohol use: Not Currently  . Drug use: Not on file  . Sexual activity: Not on file  Other Topics Concern  . Not on file  Social History Narrative  . Not on file   Social Determinants of Health   Financial Resource Strain: Not on file  Food Insecurity: Not on file  Transportation Needs: Not on file  Physical Activity: Not on file  Stress: Not on file  Social Connections: Not on file     Family History: The patient's family history includes Alzheimer's disease in his mother;  Colon polyps in his father; Diabetes in his mother.  ROS:   Review of Systems  Constitution: Negative for decreased appetite, fever and weight gain.  HENT: Negative for congestion, ear discharge, hoarse voice and sore throat.   Eyes: Negative for discharge, redness, vision loss in right eye and visual halos.  Cardiovascular: Negative for chest pain, dyspnea on exertion, leg swelling, orthopnea and palpitations.  Respiratory: Negative for cough, hemoptysis, shortness of breath and snoring.   Endocrine: Negative for heat intolerance and polyphagia.  Hematologic/Lymphatic: Negative for bleeding problem. Does not bruise/bleed easily.   Skin: Negative for flushing, nail changes, rash and suspicious lesions.  Musculoskeletal: Negative for arthritis, joint pain, muscle cramps, myalgias, neck pain and stiffness.  Gastrointestinal: Negative for abdominal pain, bowel incontinence, diarrhea and excessive appetite.  Genitourinary: Negative for decreased libido, genital sores and incomplete emptying.  Neurological: Negative for brief paralysis, focal weakness, headaches and loss of balance.  Psychiatric/Behavioral: Negative for altered mental status, depression and suicidal ideas.  Allergic/Immunologic: Negative for HIV exposure and persistent infections.    EKGs/Labs/Other Studies Reviewed:    The following studies were reviewed today:   EKG: None today  ZIO monitor The patient wore the monitor for 14 days starting May 07, 2020. Indication: Dizziness  The minimum heart rate was 53 bpm, maximum heart rate was 203 bpm, and average heart rate was 79 bpm. Predominant underlying rhythm was Sinus Rhythm.  1 short run of Ventricular Tachycardia occurred lasting 6 beats with a maximum rate of 203 bpm (average 164 bpm).   2 Supraventricular Tachycardia runs occurred, the run with the fastest interval lasting 6 beats with a maximum rate of 164 bpm, the longest lasting 4 beats with an average rate of 115 bpm.    Premature atrial complexes were rare, <1%. Premature Ventricular complexes were rare, <1%.  No pauses, No AV block and no atrial fibrillation present.  10 patient triggered events all associated with sinus rhythm.    Conclusion: This study is remarkable for the following:                             1.  A short 6 beats run of nonsustained ventricular tachycardia.                             2.  Paroxysmal supraventricular tachycardia which is likely atrial tachycardia with variable block.   Recent Labs: No results found for requested labs within last 8760 hours.  Recent Lipid Panel No results found  for: CHOL, TRIG, HDL, CHOLHDL, VLDL, LDLCALC, LDLDIRECT  Physical Exam:    VS:  BP 122/88   Pulse 69   Ht 5\' 9"  (1.753 m)   Wt 179 lb 3.2 oz (81.3 kg)   SpO2 98%   BMI 26.46 kg/m     Wt Readings from Last 3 Encounters:  06/10/20 179 lb 3.2 oz (81.3 kg)  05/07/20 177 lb 12.8 oz (80.6 kg)     GEN: Well nourished, well developed in no acute distress HEENT: Normal NECK: No JVD; No carotid bruits LYMPHATICS: No lymphadenopathy CARDIAC: S1S2 noted,RRR, no murmurs, rubs, gallops RESPIRATORY:  Clear to auscultation without rales, wheezing or rhonchi  ABDOMEN: Soft, non-tender, non-distended, +bowel sounds, no guarding. EXTREMITIES: No edema, No cyanosis, no clubbing MUSCULOSKELETAL:  No deformity  SKIN: Warm and dry NEUROLOGIC:  Alert and oriented x 3, non-focal PSYCHIATRIC:  Normal affect, good  insight  ASSESSMENT:    1. Type 2 diabetes mellitus with hypertriglyceridemia (HCC)   2. Dyslipidemia   3. Primary hypertension   4. NSVT (nonsustained ventricular tachycardia) (HCC)   5. PAT (paroxysmal atrial tachycardia) (HCC)    PLAN:     We discussed the monitor results.  Explained to him nonsustained ventricular tachycardia means as well as explaining the atrial tachycardia.  He does have some palpitations to work on to start low-dose beta-blocker (Toprol-XL 12.5 mg daily) to see if this is going to help.  I have answered his questions about the new medications. His CT scan is pending for July 03, 2019. His blood pressure acceptable today in the office no changes will be made. Diabetes mellitus per his primary team. Continue with Lipitor 40.   The patient is in agreement with the above plan. The patient left the office in stable condition.  The patient will follow up in   Medication Adjustments/Labs and Tests Ordered: Current medicines are reviewed at length with the patient today.  Concerns regarding medicines are outlined above.  No orders of the defined types were  placed in this encounter.  No orders of the defined types were placed in this encounter.   Patient Instructions  Medication Instructions:  Your physician has recommended you make the following change in your medication:   Start Toprol XL 12.5 mg at night.  *If you need a refill on your cardiac medications before your next appointment, please call your pharmacy*   Lab Work: None ordered If you have labs (blood work) drawn today and your tests are completely normal, you will receive your results only by: Marland Kitchen MyChart Message (if you have MyChart) OR . A paper copy in the mail If you have any lab test that is abnormal or we need to change your treatment, we will call you to review the results.   Testing/Procedures: None ordered   Follow-Up: At [facility], you and your health needs are our priority.  As part of our continuing mission to provide you with exceptional heart care, we have created designated Provider Care Teams.  These Care Teams include your primary Cardiologist (physician) and Advanced Practice Providers (APPs -  Physician Assistants and Nurse Practitioners) who all work together to provide you with the care you need, when you need it.  We recommend signing up for the patient portal called "MyChart".  Sign up information is provided on this After Visit Summary.  MyChart is used to connect with patients for Virtual Visits (Telemedicine).  Patients are able to view lab/test results, encounter notes, upcoming appointments, etc.  Non-urgent messages can be sent to your provider as well.   To learn more about what you can do with MyChart, go to [email or web address].    Your next appointment:   3 month(s)  The format for your next appointment:   In Person  Provider:   Belva Crome, MD   Other Instructions NA     Adopting a Healthy Lifestyle.  Know what a healthy weight is for you (roughly BMI <25) and aim to maintain this   Aim for 7+ servings of fruits  and vegetables daily   65-80+ fluid ounces of water or unsweet tea for healthy kidneys   Limit to max 1 drink of alcohol per day; avoid smoking/tobacco   Limit animal fats in diet for cholesterol and heart health - choose grass fed whenever available   Avoid highly processed foods, and foods high in saturated/trans fats  Aim for low stress - take time to unwind and care for your mental health   Aim for 150 min of moderate intensity exercise weekly for heart health, and weights twice weekly for bone health   Aim for 7-9 hours of sleep daily   When it comes to diets, agreement about the perfect plan isnt easy to find, even among the experts. Experts at the [facility] [employer] developed an idea known as the Healthy Eating Plate. Just imagine a plate divided into logical, healthy portions.   The emphasis is on diet quality:   Load up on vegetables and fruits - one-half of your plate: Aim for color and variety, and remember that potatoes dont count.   Go for whole grains - one-quarter of your plate: Whole wheat, barley, wheat berries, quinoa, oats, brown rice, and foods made with them. If you want pasta, go with whole wheat pasta.   Protein power - one-quarter of your plate: Fish, chicken, beans, and nuts are all healthy, versatile protein sources. Limit red meat.   The diet, however, does go beyond the plate, offering a few other suggestions.   Use healthy plant oils, such as olive, canola, soy, corn, sunflower and peanut. Check the labels, and avoid partially hydrogenated oil, which have unhealthy trans fats.   If youre thirsty, drink water. Coffee and tea are good in moderation, but skip sugary drinks and limit milk and dairy products to one or two daily servings.   The type of carbohydrate in the diet is more important than the amount. Some sources of carbohydrates, such as vegetables, fruits, whole grains, and beans-are healthier than others.   Finally, stay  active  Signed, Thomasene Ripple, DO  06/10/2020 9:16 AM    [facility]

## 2020-06-23 DIAGNOSIS — E781 Pure hyperglyceridemia: Secondary | ICD-10-CM | POA: Diagnosis not present

## 2020-06-23 DIAGNOSIS — E1169 Type 2 diabetes mellitus with other specified complication: Secondary | ICD-10-CM | POA: Diagnosis not present

## 2020-06-30 ENCOUNTER — Telehealth (HOSPITAL_COMMUNITY): Payer: Self-pay | Admitting: Emergency Medicine

## 2020-06-30 NOTE — Telephone Encounter (Signed)
Reaching out to patient to offer assistance regarding upcoming cardiac imaging study; pt verbalizes understanding of appt date/time, parking situation and where to check in, pre-test NPO status and medications ordered, and verified current allergies; name and call back number provided for further questions should they arise Rockwell Alexandria RN Navigator Cardiac Imaging [facility] [phone] office [phone] cell   Pt takes 12.5mg  metoprolol succinate daily at night. Pt instructed by tobb not to take tartrate, but bring with to appt. Shaheer Bonfield

## 2020-07-02 ENCOUNTER — Encounter: Payer: BC Managed Care – PPO | Admitting: *Deleted

## 2020-07-02 ENCOUNTER — Ambulatory Visit (HOSPITAL_COMMUNITY)
Admission: RE | Admit: 2020-07-02 | Discharge: 2020-07-02 | Disposition: A | Discharge status: Home / Self Care | Payer: BC Managed Care – PPO | Source: Ambulatory Visit | Attending: Cardiology | Admitting: Cardiology

## 2020-07-02 ENCOUNTER — Other Ambulatory Visit: Payer: Self-pay

## 2020-07-02 DIAGNOSIS — I2510 Atherosclerotic heart disease of native coronary artery without angina pectoris: Secondary | ICD-10-CM | POA: Diagnosis not present

## 2020-07-02 DIAGNOSIS — R0789 Other chest pain: Secondary | ICD-10-CM

## 2020-07-02 DIAGNOSIS — Z006 Encounter for examination for normal comparison and control in clinical research program: Secondary | ICD-10-CM

## 2020-07-02 DIAGNOSIS — K449 Diaphragmatic hernia without obstruction or gangrene: Secondary | ICD-10-CM | POA: Diagnosis not present

## 2020-07-02 MED ORDER — IOHEXOL 350 MG/ML SOLN
80.0000 mL | Freq: Once | INTRAVENOUS | Status: AC
  Administered 2020-07-02: 80 mL via INTRAVENOUS

## 2020-07-02 MED ORDER — NITROGLYCERIN 0.4 MG SL SUBL
0.8000 mg | SUBLINGUAL_TABLET | Freq: Once | SUBLINGUAL | Status: AC
  Administered 2020-07-02: 0.8 mg via SUBLINGUAL

## 2020-07-02 MED ORDER — NITROGLYCERIN 0.4 MG SL SUBL
SUBLINGUAL_TABLET | SUBLINGUAL | Status: AC
  Filled 2020-07-02: qty 2

## 2020-07-02 NOTE — Research (Signed)
Subject Name: Richard Lane  Subject met inclusion and exclusion criteria.  The informed consent form, study requirements and expectations were reviewed with the subject and questions and concerns were addressed prior to the signing of the consent form.  The subject verbalized understanding of the trial requirements.  The subject agreed to participate in the IDENTIFY trial and signed the informed consent at 0700 on 07/02/20  The informed consent was obtained prior to performance of any protocol-specific procedures for the subject.  A copy of the signed informed consent was given to the subject and a copy was placed in the subject's medical record.   Timoteo Gaul

## 2020-07-05 ENCOUNTER — Telehealth: Payer: Self-pay

## 2020-07-05 DIAGNOSIS — I2510 Atherosclerotic heart disease of native coronary artery without angina pectoris: Secondary | ICD-10-CM | POA: Diagnosis not present

## 2020-07-05 MED ORDER — ASPIRIN EC 81 MG PO TBEC: 81.0000 mg | DELAYED_RELEASE_TABLET | Freq: Every day | ORAL | 3 refills | Status: DC

## 2020-07-05 NOTE — Telephone Encounter (Signed)
Spoke with patient regarding results and recommendation.  Patient verbalizes understanding and is agreeable to plan of care. Advised patient to call back with any issues or concerns.  

## 2020-07-05 NOTE — Telephone Encounter (Signed)
-----   Message from Richard Ripple, DO sent at 07/05/2020 11:49 AM EST ----- Your coronary CT scan show that you do have coronary artery disease.  I would like to recommend aspirin 81 mg daily continue your atorvastatin. Your noncardiac portion of the CT shows a large hiatal hernia which we are going to forward to Dr. Casper Harrison, he might need to see a Development worker, international aid. If you l would like to make a sooner appointment to see me that will be fine as well so we can discuss the details of your coronary CTA.

## 2020-07-26 DIAGNOSIS — J069 Acute upper respiratory infection, unspecified: Secondary | ICD-10-CM | POA: Diagnosis not present

## 2020-07-26 DIAGNOSIS — J029 Acute pharyngitis, unspecified: Secondary | ICD-10-CM | POA: Diagnosis not present

## 2020-08-17 ENCOUNTER — Ambulatory Visit: Payer: BC Managed Care – PPO | Admitting: Cardiology

## 2020-09-03 DIAGNOSIS — Z800 Family history of malignant neoplasm of digestive organs: Secondary | ICD-10-CM | POA: Diagnosis not present

## 2020-09-03 DIAGNOSIS — Z1211 Encounter for screening for malignant neoplasm of colon: Secondary | ICD-10-CM | POA: Diagnosis not present

## 2020-09-10 ENCOUNTER — Ambulatory Visit: Payer: BC Managed Care – PPO | Admitting: Cardiology

## 2020-09-29 ENCOUNTER — Ambulatory Visit: Payer: BC Managed Care – PPO | Admitting: Cardiology

## 2020-09-29 ENCOUNTER — Encounter: Payer: Self-pay | Admitting: Cardiology

## 2020-09-29 ENCOUNTER — Other Ambulatory Visit: Payer: Self-pay

## 2020-09-29 VITALS — BP 112/72 | HR 80 | Ht 69.0 in | Wt 176.8 lb

## 2020-09-29 DIAGNOSIS — E1169 Type 2 diabetes mellitus with other specified complication: Secondary | ICD-10-CM

## 2020-09-29 DIAGNOSIS — I2510 Atherosclerotic heart disease of native coronary artery without angina pectoris: Secondary | ICD-10-CM | POA: Diagnosis not present

## 2020-09-29 DIAGNOSIS — E785 Hyperlipidemia, unspecified: Secondary | ICD-10-CM

## 2020-09-29 DIAGNOSIS — E119 Type 2 diabetes mellitus without complications: Secondary | ICD-10-CM | POA: Insufficient documentation

## 2020-09-29 DIAGNOSIS — K449 Diaphragmatic hernia without obstruction or gangrene: Secondary | ICD-10-CM

## 2020-09-29 DIAGNOSIS — E781 Pure hyperglyceridemia: Secondary | ICD-10-CM

## 2020-09-29 NOTE — Patient Instructions (Signed)
Medication Instructions:  Your physician recommends that you continue on your current medications as directed. Please refer to the Current Medication list given to you today.  *If you need a refill on your cardiac medications before your next appointment, please call your pharmacy*   Lab Work: Your physician recommends that you return for lab work in: TODAY: HbA1C Friday: Lipids If you have labs (blood work) drawn today and your tests are completely normal, you will receive your results only by: Marland Kitchen MyChart Message (if you have MyChart) OR . A paper copy in the mail If you have any lab test that is abnormal or we need to change your treatment, we will call you to review the results.   Testing/Procedures: None   Follow-Up: At [facility], you and your health needs are our priority.  As part of our continuing mission to provide you with exceptional heart care, we have created designated Provider Care Teams.  These Care Teams include your primary Cardiologist (physician) and Advanced Practice Providers (APPs -  Physician Assistants and Nurse Practitioners) who all work together to provide you with the care you need, when you need it.  We recommend signing up for the patient portal called "MyChart".  Sign up information is provided on this After Visit Summary.  MyChart is used to connect with patients for Virtual Visits (Telemedicine).  Patients are able to view lab/test results, encounter notes, upcoming appointments, etc.  Non-urgent messages can be sent to your provider as well.   To learn more about what you can do with MyChart, go to [email or web address].    Your next appointment:   1 year(s)  The format for your next appointment:   In Person  Provider:   Thomasene Ripple, DO   Other Instructions

## 2020-09-29 NOTE — Progress Notes (Signed)
Cardiology Office Note:    Date:  09/29/2020   ID:  Richard Lane, DOB 1961/03/30, MRN [id number]  PCP:  Street, Stephanie Coup, MD  Cardiologist:  Thomasene Ripple, DO  Electrophysiologist:  None   Referring MD: Street, Stephanie Coup, *   I am doing well   History of Present Illness:    Richard Lane is a 60 y.o. male with a hx of coronary artery disease seen on coronary CTA, nonsustained ventricular tachycardia, and atrial tachycardia is now on metoprolol, dyslipidemia, type 2 diabetes, history of hypertriglyceridemia.  He tells me since I last saw him he has been doing well from a cardiovascular standpoint.  He reports that the metoprolol since started his palpitations have resolved.  He denies any chest pain or shortness of breath.   Past Medical History:  Diagnosis Date  . Alkaline phosphatase raised 06/09/2020  . Atherosclerosis of native artery of both lower extremities with intermittent claudication (HCC)   . Diverticulosis   . Dizziness 05/07/2020  . Dyslipidemia   . Encounter for general adult medical examination with abnormal findings 06/09/2020  . Fatty liver 06/09/2020  . Generalized anxiety disorder 06/09/2020  . Gouty arthropathy   . Hypertriglyceridemia 05/07/2020  . Mixed hyperlipidemia 05/07/2020  . Other chest pain 05/07/2020  . Transient neurologic deficit 11/2018  . Type 2 diabetes mellitus with hypertriglyceridemia (HCC)   . Type 2 diabetes mellitus without complication (HCC)   . Rochele Pages 06/09/2020    Past Surgical History:  Procedure Laterality Date  . FOOT FRACTURE SURGERY Left     Current Medications: Current Meds  Medication Sig  . allopurinol (ZYLOPRIM) 300 MG tablet Take 300 mg by mouth daily.  Marland Kitchen aspirin EC 81 MG tablet Take 1 tablet (81 mg total) by mouth daily. Swallow whole.  Marland Kitchen atorvastatin (LIPITOR) 40 MG tablet Take 40 mg by mouth daily.  . Dulaglutide (TRULICITY) 0.75 MG/0.5ML SOPN once a week.  . ezetimibe (ZETIA) 10 MG tablet Take 10 mg  by mouth daily.  Boris Lown Oil 500 MG CAPS 500 mg See admin instructions.  . metFORMIN (GLUCOPHAGE) 1000 MG tablet Take 1,000 mg by mouth 2 (two) times daily with a meal.  . metoprolol succinate (TOPROL XL) 25 MG 24 hr tablet Take 0.5 tablets (12.5 mg total) by mouth daily.  . Semaglutide,0.25 or 0.5MG /DOS, (OZEMPIC, 0.25 OR 0.5 MG/DOSE,) 2 MG/1.5ML SOPN Inject into the skin once a week.     Allergies:   Patient has no known allergies.   Social History   Socioeconomic History  . Marital status: Married    Spouse name: Not on file  . Number of children: Not on file  . Years of education: Not on file  . Highest education level: Not on file  Occupational History  . Not on file  Tobacco Use  . Smoking status: Former Games developer  . Smokeless tobacco: Never Used  . Tobacco comment: QUIT SMOKING 24 YR AGO  Substance and Sexual Activity  . Alcohol use: Not Currently  . Drug use: Not on file  . Sexual activity: Not on file  Other Topics Concern  . Not on file  Social History Narrative  . Not on file   Social Determinants of Health   Financial Resource Strain: Not on file  Food Insecurity: Not on file  Transportation Needs: Not on file  Physical Activity: Not on file  Stress: Not on file  Social Connections: Not on file     Family History: The patient's family  history includes Alzheimer's disease in his mother; Colon polyps in his father; Diabetes in his mother.  ROS:   Review of Systems  Constitution: Negative for decreased appetite, fever and weight gain.  HENT: Negative for congestion, ear discharge, hoarse voice and sore throat.   Eyes: Negative for discharge, redness, vision loss in right eye and visual halos.  Cardiovascular: Negative for chest pain, dyspnea on exertion, leg swelling, orthopnea and palpitations.  Respiratory: Negative for cough, hemoptysis, shortness of breath and snoring.   Endocrine: Negative for heat intolerance and polyphagia.  Hematologic/Lymphatic:  Negative for bleeding problem. Does not bruise/bleed easily.  Skin: Negative for flushing, nail changes, rash and suspicious lesions.  Musculoskeletal: Negative for arthritis, joint pain, muscle cramps, myalgias, neck pain and stiffness.  Gastrointestinal: Negative for abdominal pain, bowel incontinence, diarrhea and excessive appetite.  Genitourinary: Negative for decreased libido, genital sores and incomplete emptying.  Neurological: Negative for brief paralysis, focal weakness, headaches and loss of balance.  Psychiatric/Behavioral: Negative for altered mental status, depression and suicidal ideas.  Allergic/Immunologic: Negative for HIV exposure and persistent infections.    EKGs/Labs/Other Studies Reviewed:    The following studies were reviewed today:   EKG: None today  Coronary CTA which was done in July 02, 2020 Aorta: Normal size.  No calcifications.  No dissection.  Aortic Valve:  Trileaflet.  No calcifications.  Coronary Arteries:  Normal coronary origin.  Right dominance.  RCA is a large dominant artery that gives rise to PDA and PLVB. There is no plaque. The proximal RCA is tortuous.  Left main is a large artery that gives rise to LAD and LCX arteries.  LAD is a large vessel. There is minimal calcified plaque in the proximal LAD. The mid LAD with moderate (50-60%) mixed plaque. No plaque in the distal LAD.  LCX is a non-dominant artery that gives rise to one large OM1 branch. There is no plaque.  Other findings:  Normal pulmonary vein drainage into the left atrium.  Normal left atrial appendage without a thrombus.  Normal size of the pulmonary artery.  Very small Patent Foramen Ovale.  IMPRESSION: 1. Coronary calcium score of 37.5. This was 57 percentile for age and sex matched control.  2. Normal coronary origin with right dominance.  3. Moderate CAD.  CADRADS 3.  This study will be sent for FFRct.  4. Very small Patent Foramen  [place]vale.  Thomasene Ripple, DO   Electronically Signed   By: Thomasene Ripple DO   On: 07/02/2020 13:25   Addended by Thomasene Ripple, DO on 07/02/2020 1:27 PM    Study Result  Narrative & Impression  EXAM: OVER-READ INTERPRETATION  CT CHEST  The following report is an over-read performed by radiologist Dr. Trudie Reed of [facility] on 07/02/2020. This over-read does not include interpretation of cardiac or coronary anatomy or pathology. The coronary calcium score/coronary CTA interpretation by the cardiologist is attached.  COMPARISON:  None.  FINDINGS: Large hiatal hernia. Within the visualized portions of the thorax there are no suspicious appearing pulmonary nodules or masses, there is no acute consolidative airspace disease, no pleural effusions, no pneumothorax and no lymphadenopathy. Visualized portions of the upper abdomen are unremarkable. There are no aggressive appearing lytic or blastic lesions noted in the visualized portions of the skeleton.  IMPRESSION: 1. Large hiatal hernia.     ZIO monitor The patient wore the monitor for 14 days starting May 07, 2020. Indication: Dizziness  The minimum heart rate was 53 bpm, maximum  heart rate was 203 bpm, and average heart rate was 79 bpm. Predominant underlying rhythm was Sinus Rhythm.  1 short run of Ventricular Tachycardia occurred lasting 6 beats with a maximum rate of 203 bpm (average 164 bpm).   2 Supraventricular Tachycardia runs occurred, the run with the fastest interval lasting 6 beats with a maximum rate of 164 bpm, the longest lasting 4 beats with an average rate of 115 bpm.    Premature atrial complexes were rare, <1%. Premature Ventricular complexes were rare, <1%.  No pauses, No AV block and no atrial fibrillation present.  10 patient triggered events all associated with sinus rhythm.    Conclusion: This study is remarkable for the following:                              1.  A short 6 beats run of nonsustained ventricular tachycardia.                             2.  Paroxysmal supraventricular tachycardia which is likely atrial tachycardia with variable block.   Recent Labs: No results found for requested labs within last 8760 hours.  Recent Lipid Panel No results found for: CHOL, TRIG, HDL, CHOLHDL, VLDL, LDLCALC, LDLDIRECT  Physical Exam:    VS:  BP 112/72   Pulse 80   Ht 5\' 9"  (1.753 m)   Wt 176 lb 12.8 oz (80.2 kg)   SpO2 95%   BMI 26.11 kg/m     Wt Readings from Last 3 Encounters:  09/29/20 176 lb 12.8 oz (80.2 kg)  06/10/20 179 lb 3.2 oz (81.3 kg)  05/07/20 177 lb 12.8 oz (80.6 kg)     GEN: Well nourished, well developed in no acute distress HEENT: Normal NECK: No JVD; No carotid bruits LYMPHATICS: No lymphadenopathy CARDIAC: S1S2 noted,RRR, no murmurs, rubs, gallops RESPIRATORY:  Clear to auscultation without rales, wheezing or rhonchi  ABDOMEN: Soft, non-tender, non-distended, +bowel sounds, no guarding. EXTREMITIES: No edema, No cyanosis, no clubbing MUSCULOSKELETAL:  No deformity  SKIN: Warm and dry NEUROLOGIC:  Alert and oriented x 3, non-focal PSYCHIATRIC:  Normal affect, good insight  ASSESSMENT:    1. Hiatal hernia   2. Coronary artery disease involving native coronary artery of native heart without angina pectoris   3. Type 2 diabetes mellitus with other specified complication, without long-term current use of insulin (HCC)   4. Hyperlipidemia, unspecified hyperlipidemia type   5. Hypertriglyceridemia    PLAN:     His coronary CTA showed evidence of coronary artery disease as well as his noncardiac portion showed a large hiatal hernia.  He has had some abdominal discomfort.  He has not been able to be evaluated by surgery.  We can refer the patient to see surgery assess his hernia if there is any need for any surgical intervention.  In terms of his coronary artery disease no anginal symptoms we will continue him  currently on his medication regimen which includes aspirin and, Zetia 10 mg and atorvastatin 40 mg daily.  He is being treated for hypertriglyceridemia his last blood work was seen like to do is repeat his lipid profile.  We discussed if his triglycerides still elevated I like to place the patient on Vascepa 500 mg twice a day to help with cardiovascular risk reduction.  This is being managed by his primary care doctor.  No adjustments for antidiabetic  medications were made today.  The patient is in agreement with the above plan. The patient left the office in stable condition.  The patient will follow up in 1 year or sooner if needed.  Medication Adjustments/Labs and Tests Ordered: Current medicines are reviewed at length with the patient today.  Concerns regarding medicines are outlined above.  Orders Placed This Encounter  Procedures  . Hemoglobin A1c  . Lipid panel  . Ambulatory referral to General Surgery   No orders of the defined types were placed in this encounter.   Patient Instructions  Medication Instructions:  Your physician recommends that you continue on your current medications as directed. Please refer to the Current Medication list given to you today.  *If you need a refill on your cardiac medications before your next appointment, please call your pharmacy*   Lab Work: Your physician recommends that you return for lab work in: TODAY: HbA1C Friday: Lipids If you have labs (blood work) drawn today and your tests are completely normal, you will receive your results only by: Marland Kitchen MyChart Message (if you have MyChart) OR . A paper copy in the mail If you have any lab test that is abnormal or we need to change your treatment, we will call you to review the results.   Testing/Procedures: None   Follow-Up: At [facility], you and your health needs are our priority.  As part of our continuing mission to provide you with exceptional heart care, we have created  designated Provider Care Teams.  These Care Teams include your primary Cardiologist (physician) and Advanced Practice Providers (APPs -  Physician Assistants and Nurse Practitioners) who all work together to provide you with the care you need, when you need it.  We recommend signing up for the patient portal called "MyChart".  Sign up information is provided on this After Visit Summary.  MyChart is used to connect with patients for Virtual Visits (Telemedicine).  Patients are able to view lab/test results, encounter notes, upcoming appointments, etc.  Non-urgent messages can be sent to your provider as well.   To learn more about what you can do with MyChart, go to [email or web address].    Your next appointment:   1 year(s)  The format for your next appointment:   In Person  Provider:   Thomasene Ripple, DO   Other Instructions      Adopting a Healthy Lifestyle.  Know what a healthy weight is for you (roughly BMI <25) and aim to maintain this   Aim for 7+ servings of fruits and vegetables daily   65-80+ fluid ounces of water or unsweet tea for healthy kidneys   Limit to max 1 drink of alcohol per day; avoid smoking/tobacco   Limit animal fats in diet for cholesterol and heart health - choose grass fed whenever available   Avoid highly processed foods, and foods high in saturated/trans fats   Aim for low stress - take time to unwind and care for your mental health   Aim for 150 min of moderate intensity exercise weekly for heart health, and weights twice weekly for bone health   Aim for 7-9 hours of sleep daily   When it comes to diets, agreement about the perfect plan isnt easy to find, even among the experts. Experts at the [facility] [employer] developed an idea known as the Healthy Eating Plate. Just imagine a plate divided into logical, healthy portions.   The emphasis is on diet quality:   Load up  on vegetables and fruits - one-half of your plate: Aim for  color and variety, and remember that potatoes dont count.   Go for whole grains - one-quarter of your plate: Whole wheat, barley, wheat berries, quinoa, oats, brown rice, and foods made with them. If you want pasta, go with whole wheat pasta.   Protein power - one-quarter of your plate: Fish, chicken, beans, and nuts are all healthy, versatile protein sources. Limit red meat.   The diet, however, does go beyond the plate, offering a few other suggestions.   Use healthy plant oils, such as olive, canola, soy, corn, sunflower and peanut. Check the labels, and avoid partially hydrogenated oil, which have unhealthy trans fats.   If youre thirsty, drink water. Coffee and tea are good in moderation, but skip sugary drinks and limit milk and dairy products to one or two daily servings.   The type of carbohydrate in the diet is more important than the amount. Some sources of carbohydrates, such as vegetables, fruits, whole grains, and beans-are healthier than others.   Finally, stay active  Signed, Thomasene Ripple, DO  09/29/2020 5:38 PM    [facility]

## 2020-09-30 LAB — HEMOGLOBIN A1C
Est. average glucose Bld gHb Est-mCnc: 206 mg/dL
Hgb A1c MFr Bld: 8.8 % — ABNORMAL HIGH (ref 4.8–5.6)

## 2020-10-07 ENCOUNTER — Other Ambulatory Visit: Payer: Self-pay

## 2020-10-07 DIAGNOSIS — R7309 Other abnormal glucose: Secondary | ICD-10-CM

## 2020-10-07 NOTE — Progress Notes (Signed)
Referral sent in

## 2020-10-25 ENCOUNTER — Other Ambulatory Visit: Payer: Self-pay

## 2020-10-25 ENCOUNTER — Encounter: Payer: Self-pay | Admitting: Surgery

## 2020-10-25 ENCOUNTER — Ambulatory Visit: Payer: BC Managed Care – PPO | Admitting: Surgery

## 2020-10-25 VITALS — BP 126/88 | HR 68 | Temp 98.1°F | Ht 69.0 in | Wt 175.6 lb

## 2020-10-25 DIAGNOSIS — K449 Diaphragmatic hernia without obstruction or gangrene: Secondary | ICD-10-CM | POA: Diagnosis not present
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
## 2020-10-25 NOTE — Patient Instructions (Addendum)
Ct Scan scheduled 11/04/20@ 11 am [facility] .Nothing by mouth 4 hours prior. Please go to Outpatient Imaging today to pick up the prep kit and instructions.  Barium swallow study scheduled 11/11/20 @ 8 am at [facility]. Nothing to eat/drink 3 hours prior.  Referral sent to [facility].Someone from their office will call to schedule an appointment within 7-10 days.   Please see your follow up appointment listed below. Be sure to have all the above test done priro to seeing Dr.Pabon.   Hiatal Hernia  A hiatal hernia occurs when part of the stomach slides above the muscle that separates the abdomen from the chest (diaphragm). A person can be born with a hiatal hernia (congenital), or it may develop over time. In almost all cases of hiatal hernia, only the top part of the stomach pushes through the diaphragm. Many people have a hiatal hernia with no symptoms. The larger the hernia, the more likely it is that you will have symptoms. In some cases, a hiatal hernia allows stomach acid to flow back into the tube that carries food from your mouth to your stomach (esophagus). This may cause heartburn symptoms. Severe heartburn symptoms may mean that you have developed a condition called gastroesophageal reflux disease (GERD). What are the causes? This condition is caused by a weakness in the opening (hiatus) where the esophagus passes through the diaphragm to attach to the upper part of the stomach. A person may be born with a weakness in the hiatus, or a weakness can develop over time. What increases the risk? This condition is more likely to develop in:  Older people. Age is a major risk factor for a hiatal hernia, especially if you are over the age of 68.  Pregnant women.  People who are overweight.  People who have frequent constipation. What are the signs or symptoms? Symptoms of this condition usually develop in the form of GERD symptoms. Symptoms  include:  Heartburn.  Belching.  Indigestion.  Trouble swallowing.  Coughing or wheezing.  Sore throat.  Hoarseness.  Chest pain.  Nausea and vomiting. How is this diagnosed? This condition may be diagnosed during testing for GERD. Tests that may be done include:  X-rays of your stomach or chest.  An upper gastrointestinal (GI) series. This is an X-ray exam of your GI tract that is taken after you swallow a chalky liquid that shows up clearly on the X-ray.  Endoscopy. This is a procedure to look into your stomach using a thin, flexible tube that has a tiny camera and light on the end of it. How is this treated? This condition may be treated by:  Dietary and lifestyle changes to help reduce GERD symptoms.  Medicines. These may include: ? Over-the-counter antacids. ? Medicines that make your stomach empty more quickly. ? Medicines that block the production of stomach acid (H2 blockers). ? Stronger medicines to reduce stomach acid (proton pump inhibitors).  Surgery to repair the hernia, if other treatments are not helping. If you have no symptoms, you may not need treatment. Follow these instructions at home: Lifestyle and activity  Do not use any products that contain nicotine or tobacco, such as cigarettes and e-cigarettes. If you need help quitting, ask your health care provider.  Try to achieve and maintain a healthy body weight.  Avoid putting pressure on your abdomen. Anything that puts pressure on your abdomen increases the amount of acid that may be pushed up into your esophagus. ? Avoid bending over, especially after eating. ?  Raise the head of your bed by putting blocks under the legs. This keeps your head and esophagus higher than your stomach. ? Do not wear tight clothing around your chest or stomach. ? Try not to strain when having a bowel movement, when urinating, or when lifting heavy objects. Eating and drinking  Avoid foods that can worsen GERD  symptoms. These may include: ? Fatty foods, like fried foods. ? Citrus fruits, like oranges or lemon. ? Other foods and drinks that contain acid, like orange juice or tomatoes. ? Spicy food. ? Chocolate.  Eat frequent small meals instead of three large meals a day. This helps prevent your stomach from getting too full. ? Eat slowly. ? Do not lie down right after eating. ? Do not eat 1-2 hours before bed.  Do not drink beverages with caffeine. These include cola, coffee, cocoa, and tea.  Do not drink alcohol. General instructions  Take over-the-counter and prescription medicines only as told by your health care provider.  Keep all follow-up visits as told by your health care provider. This is important. Contact a health care provider if:  Your symptoms are not controlled with medicines or lifestyle changes.  You are having trouble swallowing.  You have coughing or wheezing that will not go away. Get help right away if:  Your pain is getting worse.  Your pain spreads to your arms, neck, jaw, teeth, or back.  You have shortness of breath.  You sweat for no reason.  You feel sick to your stomach (nauseous) or you vomit.  You vomit blood.  You have bright red blood in your stools.  You have black, tarry stools. This information is not intended to replace advice given to you by your health care provider. Make sure you discuss any questions you have with your health care provider. Document Revised: 05/25/2017 Document Reviewed: 01/15/2017 Elsevier Patient Education  [place].

## 2020-11-04 ENCOUNTER — Ambulatory Visit: Payer: BC Managed Care – PPO

## 2020-11-11 ENCOUNTER — Ambulatory Visit
Admission: RE | Admit: 2020-11-11 | Discharge: 2020-11-11 | Disposition: A | Discharge status: Home / Self Care | Payer: BC Managed Care – PPO | Source: Ambulatory Visit | Attending: Surgery | Admitting: Surgery

## 2020-11-11 ENCOUNTER — Other Ambulatory Visit: Payer: Self-pay | Admitting: Surgery

## 2020-11-11 ENCOUNTER — Other Ambulatory Visit: Payer: Self-pay

## 2020-11-11 ENCOUNTER — Telehealth: Payer: Self-pay | Admitting: *Deleted

## 2020-11-11 ENCOUNTER — Telehealth: Payer: Self-pay

## 2020-11-11 DIAGNOSIS — K449 Diaphragmatic hernia without obstruction or gangrene: Secondary | ICD-10-CM | POA: Diagnosis not present

## 2020-11-11 NOTE — Telephone Encounter (Signed)
I called patient for 90-day Identify study phone call. I left message for patient to call me back or e-mail me. 

## 2020-11-11 NOTE — Telephone Encounter (Signed)
Pt notified of swallow results and advised to keep f/u appt with Dr. Everlene Farrier. Verbalizes understanding.

## 2020-11-16 ENCOUNTER — Other Ambulatory Visit: Payer: Self-pay

## 2020-11-16 ENCOUNTER — Ambulatory Visit
Admission: RE | Admit: 2020-11-16 | Discharge: 2020-11-16 | Disposition: A | Discharge status: Home / Self Care | Payer: BC Managed Care – PPO | Source: Ambulatory Visit | Attending: Surgery | Admitting: Surgery

## 2020-11-16 DIAGNOSIS — K5730 Diverticulosis of large intestine without perforation or abscess without bleeding: Secondary | ICD-10-CM | POA: Diagnosis not present

## 2020-11-16 DIAGNOSIS — Z01818 Encounter for other preprocedural examination: Secondary | ICD-10-CM | POA: Diagnosis not present

## 2020-11-16 DIAGNOSIS — K449 Diaphragmatic hernia without obstruction or gangrene: Secondary | ICD-10-CM | POA: Diagnosis not present

## 2020-11-16 DIAGNOSIS — K3189 Other diseases of stomach and duodenum: Secondary | ICD-10-CM | POA: Diagnosis not present

## 2020-11-23 ENCOUNTER — Telehealth: Payer: Self-pay

## 2020-11-23 DIAGNOSIS — Z006 Encounter for examination for normal comparison and control in clinical research program: Secondary | ICD-10-CM

## 2020-11-23 NOTE — Telephone Encounter (Signed)
I have attempted without success to contact this patient by phone for his Identify 90 day follow up phone call. I left a message for patient to return my phone call with my name and callback number. An e-mail was also sent to patient.   

## 2020-11-25 ENCOUNTER — Telehealth: Payer: Self-pay

## 2020-11-25 DIAGNOSIS — Z006 Encounter for examination for normal comparison and control in clinical research program: Secondary | ICD-10-CM

## 2020-11-25 NOTE — Telephone Encounter (Signed)
Pt returned my phone call for his 90-day Identify Study follow up phone call. Patient is doing well. Pt stated he is still experencing chest pain that comes and goes at rest. Pt stated the chest pain last for 2 minutes. Pt was diagnosed with a hiatal hernia. I recommended pt call his Cardiologist if the pain continued to make sure it wasn't cardiac related due to hiatal hernia symptoms can cause chest pain. I reminded patient I would call him in January for his 1 year follow-up.

## 2020-11-30 ENCOUNTER — Other Ambulatory Visit: Payer: Self-pay

## 2020-11-30 DIAGNOSIS — K449 Diaphragmatic hernia without obstruction or gangrene: Secondary | ICD-10-CM

## 2020-11-30 DIAGNOSIS — Z01818 Encounter for other preprocedural examination: Secondary | ICD-10-CM

## 2020-12-13 ENCOUNTER — Encounter: Payer: Self-pay | Admitting: Gastroenterology

## 2020-12-14 ENCOUNTER — Ambulatory Visit
Admission: RE | Admit: 2020-12-14 | Discharge: 2020-12-14 | Disposition: A | Discharge status: Home / Self Care | Payer: BC Managed Care – PPO | Attending: Gastroenterology | Admitting: Gastroenterology

## 2020-12-14 ENCOUNTER — Other Ambulatory Visit: Payer: Self-pay

## 2020-12-14 ENCOUNTER — Ambulatory Visit: Payer: BC Managed Care – PPO | Admitting: Anesthesiology

## 2020-12-14 ENCOUNTER — Encounter
Admission: RE | Disposition: A | Discharge status: Home / Self Care | Payer: Self-pay | Source: Home / Self Care | Attending: Gastroenterology

## 2020-12-14 ENCOUNTER — Encounter: Payer: Self-pay | Admitting: Gastroenterology

## 2020-12-14 DIAGNOSIS — Z87891 Personal history of nicotine dependence: Secondary | ICD-10-CM | POA: Insufficient documentation

## 2020-12-14 DIAGNOSIS — K449 Diaphragmatic hernia without obstruction or gangrene: Secondary | ICD-10-CM | POA: Diagnosis not present

## 2020-12-14 DIAGNOSIS — Z79899 Other long term (current) drug therapy: Secondary | ICD-10-CM | POA: Insufficient documentation

## 2020-12-14 DIAGNOSIS — Z7984 Long term (current) use of oral hypoglycemic drugs: Secondary | ICD-10-CM | POA: Diagnosis not present

## 2020-12-14 DIAGNOSIS — Z01818 Encounter for other preprocedural examination: Secondary | ICD-10-CM

## 2020-12-14 DIAGNOSIS — Z7982 Long term (current) use of aspirin: Secondary | ICD-10-CM | POA: Diagnosis not present

## 2020-12-14 DIAGNOSIS — K219 Gastro-esophageal reflux disease without esophagitis: Secondary | ICD-10-CM | POA: Diagnosis not present

## 2020-12-14 DIAGNOSIS — E782 Mixed hyperlipidemia: Secondary | ICD-10-CM | POA: Diagnosis not present

## 2020-12-14 HISTORY — PX: ESOPHAGOGASTRODUODENOSCOPY (EGD) WITH PROPOFOL: SHX5813

## 2020-12-14 LAB — GLUCOSE, CAPILLARY: Glucose-Capillary: 206 mg/dL — ABNORMAL HIGH (ref 70–99)

## 2020-12-14 SURGERY — ESOPHAGOGASTRODUODENOSCOPY (EGD) WITH PROPOFOL
Anesthesia: General

## 2020-12-14 MED ORDER — PROPOFOL 500 MG/50ML IV EMUL
INTRAVENOUS | Status: DC | PRN
  Administered 2020-12-14: 200 ug/kg/min via INTRAVENOUS

## 2020-12-14 MED ORDER — LIDOCAINE HCL (PF) 2 % IJ SOLN
INTRAMUSCULAR | Status: DC | PRN
  Administered 2020-12-14: 100 mg via INTRADERMAL

## 2020-12-14 MED ORDER — PROPOFOL 10 MG/ML IV BOLUS
INTRAVENOUS | Status: DC | PRN
  Administered 2020-12-14: 20 mg via INTRAVENOUS
  Administered 2020-12-14: 60 mg via INTRAVENOUS

## 2020-12-14 MED ORDER — SODIUM CHLORIDE 0.9 % IV SOLN: INTRAVENOUS | Status: DC

## 2020-12-14 NOTE — Op Note (Signed)
[facility] Gastroenterology Patient Name: Richard Lane Procedure Date: 12/14/2020 9:55 AM MRN: [id number] Account #: [id number] Date of Birth: 01-19-1961 Admit Type: Outpatient Age: 60 Room: [facility] ENDO ROOM 4 Gender: Male Note Status: Finalized Procedure:             Upper GI endoscopy Indications:           Preoperative assessment Providers:             Midge Minium MD, MD Referring MD:          Stephanie Coup. Street (Referring MD) Medicines:             Propofol per Anesthesia Complications:         No immediate complications. Procedure:             Pre-Anesthesia Assessment:                        - Prior to the procedure, a History and Physical was                         performed, and patient medications and allergies were                         reviewed. The patient's tolerance of previous                         anesthesia was also reviewed. The risks and benefits                         of the procedure and the sedation options and risks                         were discussed with the patient. All questions were                         answered, and informed consent was obtained. Prior                         Anticoagulants: The patient has taken no previous                         anticoagulant or antiplatelet agents. ASA Grade                         Assessment: II - A patient with mild systemic disease.                         After reviewing the risks and benefits, the patient                         was deemed in satisfactory condition to undergo the                         procedure.                        After obtaining informed consent, the endoscope was  passed under direct vision. Throughout the procedure,                         the patient's blood pressure, pulse, and oxygen                         saturations were monitored continuously. The Endoscope                         was introduced through the mouth, and advanced  to the                         second part of duodenum. The upper GI endoscopy was                         accomplished without difficulty. The patient tolerated                         the procedure well. Findings:      A 6 cm hiatal hernia was present.      The stomach was normal.      The examined duodenum was normal. Impression:            - 6 cm hiatal hernia.                        - Normal stomach.                        - Normal examined duodenum.                        - No specimens collected. Recommendation:        - Discharge patient to home.                        - Resume previous diet.                        - Continue present medications. Procedure Code(s):     --- Professional ---                        [phone], Esophagogastroduodenoscopy, flexible,                         transoral; diagnostic, including collection of                         specimen(s) by brushing or washing, when performed                         (separate procedure) Diagnosis Code(s):     --- Professional ---                        C37.628, Encounter for other preprocedural examination CPT copyright 2019 American Medical Association. All rights reserved. The codes documented in this report are preliminary and upon coder review may  be revised to meet current compliance requirements. Midge Minium MD, MD 12/14/2020 10:21:02 AM This report has been signed electronically. Number of Addenda: 0 Note Initiated On: 12/14/2020 9:55 AM  Estimated Blood Loss:  Estimated blood loss: none.      [facility]

## 2020-12-14 NOTE — Anesthesia Preprocedure Evaluation (Signed)
Anesthesia Evaluation  Patient identified by MRN, date of birth, ID band Patient awake    Reviewed: Allergy & Precautions, NPO status , Patient's Chart, lab work & pertinent test results  History of Anesthesia Complications Negative for: history of anesthetic complications  Airway Mallampati: II  TM Distance: >3 FB Neck ROM: Full    Dental no notable dental hx.    Pulmonary former smoker,    Pulmonary exam normal breath sounds clear to auscultation       Cardiovascular Exercise Tolerance: Good hypertension, + CAD and + Peripheral Vascular Disease  + dysrhythmias  Rhythm:Regular Rate:Normal   From cardiology note: "60 y.o. male with a hx of coronary artery disease seen on coronary CTA, nonsustained ventricular tachycardia, and atrial tachycardia is now on metoprolol,dyslipidemia, type 2 diabetes, history of hypertriglyceridemia.  He tells me since I last saw him he has been doing well from a cardiovascular standpoint.  He reports that the metoprolol since started his palpitations have resolved.  He denies any chest pain or shortness of breath."   Neuro/Psych PSYCHIATRIC DISORDERS Anxiety negative neurological ROS     GI/Hepatic Neg liver ROS, hiatal hernia,   Endo/Other  diabetes  Renal/GU negative Renal ROS  negative genitourinary   Musculoskeletal  (+) Arthritis ,   Abdominal (+) - obese (BMI 25),   Peds negative pediatric ROS (+)  Hematology negative hematology ROS (+)   Anesthesia Other Findings   Reproductive/Obstetrics negative OB ROS                             Anesthesia Physical Anesthesia Plan  ASA: 3  Anesthesia Plan: General   Post-op Pain Management:    Induction: Intravenous  PONV Risk Score and Plan: Propofol infusion and TIVA  Airway Management Planned: Natural Airway and Nasal Cannula  Additional Equipment: None  Intra-op Plan:   Post-operative Plan:    Informed Consent: I have reviewed the patients History and Physical, chart, labs and discussed the procedure including the risks, benefits and alternatives for the proposed anesthesia with the patient or authorized representative who has indicated his/her understanding and acceptance.     Dental advisory given  Plan Discussed with: Anesthesiologist and CRNA  Anesthesia Plan Comments:         Anesthesia Quick Evaluation

## 2020-12-14 NOTE — Transfer of Care (Signed)
Immediate Anesthesia Transfer of Care Note  Patient: Richard Lane  Procedure(s) Performed: ESOPHAGOGASTRODUODENOSCOPY (EGD) WITH PROPOFOL  Patient Location: PACU  Anesthesia Type:General  Level of Consciousness: awake, alert  and oriented  Airway & Oxygen Therapy: Patient Spontanous Breathing and Patient connected to nasal cannula oxygen  Post-op Assessment: Report given to RN and Post -op Vital signs reviewed and stable  Post vital signs: Reviewed and stable  Last Vitals:  Vitals Value Taken Time  BP 106/83 12/14/20 1024  Temp 36.5 C 12/14/20 1023  Pulse 73 12/14/20 1024  Resp 20 12/14/20 1024  SpO2 96 % 12/14/20 1024  Vitals shown include unvalidated device data.  Last Pain:  Vitals:   12/14/20 1023  TempSrc: Temporal  PainSc: 0-No pain         Complications: No notable events documented.

## 2020-12-14 NOTE — H&P (Signed)
Midge Minium, MD [facility] [address] [place], [place] [address] Phone:[phone] Fax : [phone]  Primary Care Physician:  Street, Stephanie Coup, MD Primary Gastroenterologist:  Dr. Servando Snare  Pre-Procedure History & Physical: HPI:  Richard Lane is a 60 y.o. male is here for an endoscopy.   Past Medical History:  Diagnosis Date   Alkaline phosphatase raised 06/09/2020   Atherosclerosis of native artery of both lower extremities with intermittent claudication (HCC)    Diverticulosis    Dizziness 05/07/2020   Dyslipidemia    Encounter for general adult medical examination with abnormal findings 06/09/2020   Fatty liver 06/09/2020   Generalized anxiety disorder 06/09/2020   Gouty arthropathy    Hypertriglyceridemia 05/07/2020   Mixed hyperlipidemia 05/07/2020   Other chest pain 05/07/2020   Transient neurologic deficit 11/2018   Type 2 diabetes mellitus with hypertriglyceridemia (HCC)    Type 2 diabetes mellitus without complication (HCC)    Urolith 06/09/2020    Past Surgical History:  Procedure Laterality Date   FOOT FRACTURE SURGERY Left     Prior to Admission medications   Medication Sig Start Date End Date Taking? Authorizing Provider  allopurinol (ZYLOPRIM) 300 MG tablet Take 300 mg by mouth daily.   Yes [provider]  aspirin EC 81 MG tablet Take 1 tablet (81 mg total) by mouth daily. Swallow whole. 07/05/20  Yes Tobb, Kardie, DO  atorvastatin (LIPITOR) 40 MG tablet Take 40 mg by mouth daily.   Yes [provider]  ezetimibe (ZETIA) 10 MG tablet Take 10 mg by mouth daily.   Yes [provider]  metFORMIN (GLUCOPHAGE) 1000 MG tablet Take 1,000 mg by mouth 2 (two) times daily with a meal.   Yes [provider]  metoprolol succinate (TOPROL XL) 25 MG 24 hr tablet Take 0.5 tablets (12.5 mg total) by mouth daily. 06/10/20  Yes Tobb, Kardie, DO  Semaglutide,0.25 or 0.5MG /DOS, (OZEMPIC, 0.25 OR 0.5 MG/DOSE,) 2 MG/1.5ML SOPN  Inject into the skin once a week.   Yes [provider]    Allergies as of 11/30/2020   (No Known Allergies)    Family History  Problem Relation Age of Onset   Diabetes Mother    Alzheimer's disease Mother    Colon polyps Father     Social History   Socioeconomic History   Marital status: Married    Spouse name: Not on file   Number of children: Not on file   Years of education: Not on file   Highest education level: Not on file  Occupational History   Not on file  Tobacco Use   Smoking status: Former    Pack years: 0.00   Smokeless tobacco: Never   Tobacco comments:    QUIT SMOKING 24 YR AGO  Vaping Use   Vaping Use: Never used  Substance and Sexual Activity   Alcohol use: Not Currently   Drug use: Never   Sexual activity: Not on file  Other Topics Concern   Not on file  Social History Narrative   Not on file   Social Determinants of Health   Financial Resource Strain: Not on file  Food Insecurity: Not on file  Transportation Needs: Not on file  Physical Activity: Not on file  Stress: Not on file  Social Connections: Not on file  Intimate Partner Violence: Not on file    Review of Systems: See HPI, otherwise negative ROS  Physical Exam: BP (!) 120/95   Pulse 72  Temp (!) 97 F (36.1 C) (Temporal)   Resp 17   Ht 5\' 9"  (1.753 m)   Wt 77.1 kg   SpO2 98%   BMI 25.10 kg/m  General:   Alert,  pleasant and cooperative in NAD Head:  Normocephalic and atraumatic. Neck:  Supple; no masses or thyromegaly. Lungs:  Clear throughout to auscultation.    Heart:  Regular rate and rhythm. Abdomen:  Soft, nontender and nondistended. Normal bowel sounds, without guarding, and without rebound.   Neurologic:  Alert and  oriented x4;  grossly normal neurologically.  Impression/Plan: Richard Lane is here for an endoscopy to be performed for pre op anti reflux surgery  Risks, benefits, limitations, and alternatives regarding  endoscopy have been  reviewed with the patient.  Questions have been answered.  All parties agreeable.   [name], MD  12/14/2020, 9:54 AM

## 2020-12-14 NOTE — Anesthesia Postprocedure Evaluation (Signed)
Anesthesia Post Note  Patient: Richard Lane  Procedure(s) Performed: ESOPHAGOGASTRODUODENOSCOPY (EGD) WITH PROPOFOL  Patient location during evaluation: Endoscopy Anesthesia Type: General Level of consciousness: awake and alert Pain management: pain level controlled Vital Signs Assessment: post-procedure vital signs reviewed and stable Respiratory status: spontaneous breathing, nonlabored ventilation, respiratory function stable and patient connected to nasal cannula oxygen Cardiovascular status: blood pressure returned to baseline and stable Postop Assessment: no apparent nausea or vomiting Anesthetic complications: no   No notable events documented.   Last Vitals:  Vitals:   12/14/20 1023 12/14/20 1033  BP: 106/83 (!) 117/91  Pulse: 74   Resp: 17   Temp: 36.5 C   SpO2: 96%     Last Pain:  Vitals:   12/14/20 1043  TempSrc:   PainSc: 0-No pain                 Donato Schultz

## 2020-12-14 NOTE — Addendum Note (Signed)
Addendum  created 12/14/20 1129 by Aretha Levi, CRNA   Intraprocedure Event edited    

## 2020-12-15 ENCOUNTER — Encounter: Payer: Self-pay | Admitting: Surgery

## 2020-12-15 ENCOUNTER — Ambulatory Visit: Payer: BC Managed Care – PPO | Admitting: Surgery

## 2020-12-15 VITALS — BP 143/88 | HR 92 | Temp 98.0°F | Ht 70.0 in | Wt 177.8 lb

## 2020-12-15 DIAGNOSIS — K449 Diaphragmatic hernia without obstruction or gangrene: Secondary | ICD-10-CM | POA: Diagnosis not present

## 2020-12-15 NOTE — OR Nursing (Signed)
PT states admitting staff was very non-caring . He told them he was hungry and tired and the person at the desk told him ''we are always late''. He did not feel he was cared about as a person.

## 2020-12-15 NOTE — Progress Notes (Signed)
Outpatient Surgical Follow Up  12/15/2020  Richard Lane is an 60 y.o. male.   Chief Complaint  Patient presents with   Follow-up    Hiatal hernia    HPI: Richard Lane is a 60 y.o. male following up fro paraesophageal hernia.  He does have history of reflux.  He reports that sometimes at night reflux worsens.  He also reports some epigastric discomfort.  There is no specific alleviating aggravating factors.   He does report that has sometimes acid taste in his mouth.  He denies any cough he denies any dysphagia.  He is able to perform more than 4 METS of activity without any shortness of breath or chest pain.  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 He is diabetic  and is somewhat better controlled.  Last hemoglobin A1c was 8.8 2 months ago. Yesterday glucose  was 206. He sees endocrinology tomorrow.  He had a recent cardiac evaluation he had a preserved ejection fraction of 60 to 65%.  He had a coronary CTA per his cardiology recommendations.  I personally reviewed the images.   He completed barium swallow as well as CT of the abdomen pelvis and an endoscopy.  Please note that I have personally reviewed all the images showing evidence of type III paraesophageal hernia with about a third of stomach within the mediastinum.  There was no evidence of strictures or suspicious lesions.    Past Medical History:  Diagnosis Date   Alkaline phosphatase raised 06/09/2020   Atherosclerosis of native artery of both lower extremities with intermittent claudication (HCC)    Diverticulosis    Dizziness 05/07/2020   Dyslipidemia    Encounter for general adult medical examination with abnormal findings 06/09/2020   Fatty liver 06/09/2020   Generalized anxiety disorder 06/09/2020   Gouty arthropathy    Hypertriglyceridemia 05/07/2020   Mixed hyperlipidemia 05/07/2020   Other chest pain 05/07/2020   Transient neurologic deficit 11/2018   Type 2 diabetes mellitus with  hypertriglyceridemia (HCC)    Type 2 diabetes mellitus without complication (HCC)    Urolith 06/09/2020    Past Surgical History:  Procedure Laterality Date   ESOPHAGOGASTRODUODENOSCOPY (EGD) WITH PROPOFOL N/A 12/14/2020   Procedure: ESOPHAGOGASTRODUODENOSCOPY (EGD) WITH PROPOFOL;  Surgeon: Midge Minium, MD;  Location: [facility] ENDOSCOPY;  Service: Endoscopy;  Laterality: N/A;   FOOT FRACTURE SURGERY Left     Family History  Problem Relation Age of Onset   Diabetes Mother    Alzheimer's disease Mother    Colon polyps Father     Social History:  reports that he has quit smoking. He has never used smokeless tobacco. He reports previous alcohol use. He reports that he does not use drugs.  Allergies: No Known Allergies  Medications reviewed.    ROS Full ROS performed and is otherwise negative other than what is stated in HPI   BP (!) 143/88   Pulse 92   Temp 98 F (36.7 C) (Oral)   Ht 5\' 10"  (1.778 m)   Wt 177 lb 12.8 oz (80.6 kg)   SpO2 95%   BMI 25.51 kg/m   Physical Exam Vitals and nursing note reviewed. Exam conducted with a chaperone present.  Constitutional:      General: He is not in acute distress.    Appearance: Normal appearance. He is not ill-appearing.  Cardiovascular:     Rate and Rhythm: Normal rate and regular rhythm.     Heart sounds: No  murmur heard. Pulmonary:     Effort: Pulmonary effort is normal. No respiratory distress.     Breath sounds: Normal breath sounds. No stridor. No wheezing.  Abdominal:     General: Abdomen is flat. There is no distension.     Palpations: Abdomen is soft. There is no mass.     Tenderness: There is no abdominal tenderness. There is no guarding or rebound.     Hernia: No hernia is present.  Musculoskeletal:        General: No swelling or tenderness. Normal range of motion.     Cervical back: Normal range of motion and neck supple. No rigidity or tenderness.  Lymphadenopathy:     Cervical: No cervical adenopathy.   Skin:    General: Skin is warm and dry.     Capillary Refill: Capillary refill takes less than 2 seconds.  Neurological:     General: No focal deficit present.     Mental Status: He is alert and oriented to person, place, and time.  Psychiatric:        Mood and Affect: Mood normal.        Behavior: Behavior normal.        Thought Content: Thought content normal.        Judgment: Judgment normal.       Results for orders placed or performed during the hospital encounter of 12/14/20 (from the past 48 hour(s))  Glucose, capillary     Status: Abnormal   Collection Time: 12/14/20  9:33 AM  Result Value Ref Range   Glucose-Capillary 206 (H) 70 - 99 mg/dL    Comment: Glucose reference range applies only to samples taken after fasting for at least 8 hours.   No results found.  Assessment/Plan: 60 year old male with a symptomatic paraesophageal hernia type III.  Discussed with patient in detail about his disease process.  Given his symptoms and the type of hernia I definitely recommend repair.  I do think that he will be a good candidate for robotic approach.  He does have diabetes that is not necessarily well controlled but he is trying to make an effort to get towards goal.  He is actually going to see an endocrinologist tomorrow and hopefully labs will be drawn and his hemoglobin A1c will be better place.  Last hemoglobin  A1C was 8.8. Do think that we need to get this number below 8 to get better postoperative outcomes.  Discussed with the patient in detail about my thought process.  We also discussed about what entails the surgical intervention.  Procedure discussed with the patient in detail.  Risks, benefits and possible indications including but not limited to: Bleeding, infection, modification, potential bowel or esophageal injuries, recurrence of symptoms. Will make an appointment in about 3 to 4 weeks to further discuss surgical intervention and to recheck on the progress of his  diabetes Greater than 50% of the 45 minutes  visit was spent in counseling/coordination of care   Sterling Big, MD [facility] General Surgeon

## 2020-12-15 NOTE — Patient Instructions (Signed)
Keep your appointment with the Endocrinologist 12/16/2020. Once your diabetes is controlled, we can proceed with surgery.   If you have any concerns or question, please feel free to call our office.

## 2020-12-17 ENCOUNTER — Other Ambulatory Visit: Payer: Self-pay

## 2020-12-17 ENCOUNTER — Ambulatory Visit (INDEPENDENT_AMBULATORY_CARE_PROVIDER_SITE_OTHER): Payer: BC Managed Care – PPO | Admitting: Endocrinology

## 2020-12-17 VITALS — BP 140/86 | HR 94 | Ht 70.0 in | Wt 176.6 lb

## 2020-12-17 DIAGNOSIS — E1169 Type 2 diabetes mellitus with other specified complication: Secondary | ICD-10-CM

## 2020-12-17 DIAGNOSIS — R7309 Other abnormal glucose: Secondary | ICD-10-CM

## 2020-12-17 DIAGNOSIS — E1165 Type 2 diabetes mellitus with hyperglycemia: Secondary | ICD-10-CM

## 2020-12-17 LAB — POCT GLYCOSYLATED HEMOGLOBIN (HGB A1C): Hemoglobin A1C: 9 % — AB (ref 4.0–5.6)

## 2020-12-17 MED ORDER — METFORMIN HCL ER 500 MG PO TB24: 2000.0000 mg | ORAL_TABLET | Freq: Every day | ORAL | 3 refills | Status: AC

## 2020-12-17 MED ORDER — OZEMPIC (1 MG/DOSE) 4 MG/3ML ~~LOC~~ SOPN: 1.0000 mg | PEN_INJECTOR | SUBCUTANEOUS | 3 refills | Status: AC

## 2020-12-17 NOTE — Progress Notes (Signed)
Subjective:    Patient ID: Richard Lane, male    DOB: [birth year], 60 y.o.   MRN: [id number]  HPI pt is referred by Dr Servando Salina, for diabetes.  Pt states DM was dx'ed in 2014; he is unaware of any chronic complications; he has never been on insulin; pt says his diet and exercise are good; he has never had pancreatitis, pancreatic surgery, severe hypoglycemia or DKA.  He has lost weight since on Ozempic.  He needs to be cleared for inguinal hernia surgery.  He does not check cbg.   Past Medical History:  Diagnosis Date   Alkaline phosphatase raised 06/09/2020   Atherosclerosis of native artery of both lower extremities with intermittent claudication (HCC)    Diverticulosis    Dizziness 05/07/2020   Dyslipidemia    Encounter for general adult medical examination with abnormal findings 06/09/2020   Fatty liver 06/09/2020   Generalized anxiety disorder 06/09/2020   Gouty arthropathy    Hypertriglyceridemia 05/07/2020   Mixed hyperlipidemia 05/07/2020   Other chest pain 05/07/2020   Transient neurologic deficit 11/2018   Type 2 diabetes mellitus with hypertriglyceridemia (HCC)    Type 2 diabetes mellitus without complication (HCC)    Urolith 06/09/2020    Past Surgical History:  Procedure Laterality Date   ESOPHAGOGASTRODUODENOSCOPY (EGD) WITH PROPOFOL N/A 12/14/2020   Procedure: ESOPHAGOGASTRODUODENOSCOPY (EGD) WITH PROPOFOL;  Surgeon: Midge Minium, MD;  Location: [facility] ENDOSCOPY;  Service: Endoscopy;  Laterality: N/A;   FOOT FRACTURE SURGERY Left     Social History   Socioeconomic History   Marital status: Married    Spouse name: Not on file   Number of children: Not on file   Years of education: Not on file   Highest education level: Not on file  Occupational History   Not on file  Tobacco Use   Smoking status: Former    Pack years: 0.00   Smokeless tobacco: Never   Tobacco comments:    QUIT SMOKING 24 YR AGO  Vaping Use   Vaping Use: Never used  Substance and Sexual  Activity   Alcohol use: Not Currently   Drug use: Never   Sexual activity: Not on file  Other Topics Concern   Not on file  Social History Narrative   Not on file   Social Determinants of Health   Financial Resource Strain: Not on file  Food Insecurity: Not on file  Transportation Needs: Not on file  Physical Activity: Not on file  Stress: Not on file  Social Connections: Not on file  Intimate Partner Violence: Not on file    No current outpatient medications on file prior to visit.   No current facility-administered medications on file prior to visit.    No Known Allergies  Family History  Problem Relation Age of Onset   Diabetes Mother    Alzheimer's disease Mother    Colon polyps Father     BP 140/86 (BP Location: Right Arm, Patient Position: Sitting, Cuff Size: Normal)   Pulse 94   Ht 5\' 10"  (1.778 m)   Wt 176 lb 9.6 oz (80.1 kg)   SpO2 97%   BMI 25.34 kg/m    Review of Systems denies sob, n/v, urinary frequency, and depression.      Objective:   Physical Exam Pulses: dorsalis pedis intact bilat.   MSK: no deformity of the feet CV: no leg edema.  Skin:  no ulcer on the feet.  normal color and temp on the feet.  Neuro: sensation is intact to touch on the feet.     Lab Results  Component Value Date   HGBA1C 9.0 (A) 12/17/2020   I have reviewed outside records, and summarized: Pt was noted to have elevated A1c, and referred here.  CAD and HH were also addressed     Assessment & Plan:  Type 2 DM: uncontrolled lean body habitus.  I told pt he is at risk for developing type 1.   Patient Instructions  good diet and exercise significantly improve the control of your diabetes.  please let me know if you wish to be referred to a dietician.  high blood sugar is very risky to your health.  you should see an eye doctor and dentist every year.  It is very important to get all recommended vaccinations.   Controlling your blood pressure and cholesterol  drastically reduces the damage diabetes does to your body.  Those who smoke should quit.  Please discuss these with your doctor.   check your blood sugar once a day.  vary the time of day when you check, between before the 3 meals, and at bedtime.  also check if you have symptoms of your blood sugar being too high or too low.  please keep a record of the readings and bring it to your next appointment here (or you can bring the meter itself).  You can write it on any piece of paper.  please call us sooner if your blood sugar goes below 70, or if most of your readings are over 200.   I have sent 2 prescription to your pharmacy: to double the Ozempic, and to change metformin to extended-release.  Please come back for a follow-up appointment in 2 months.

## 2020-12-17 NOTE — Patient Instructions (Addendum)
good diet and exercise significantly improve the control of your diabetes.  please let me know if you wish to be referred to a dietician.  high blood sugar is very risky to your health.  you should see an eye doctor and dentist every year.  It is very important to get all recommended vaccinations.   Controlling your blood pressure and cholesterol drastically reduces the damage diabetes does to your body.  Those who smoke should quit.  Please discuss these with your doctor.   check your blood sugar once a day.  vary the time of day when you check, between before the 3 meals, and at bedtime.  also check if you have symptoms of your blood sugar being too high or too low.  please keep a record of the readings and bring it to your next appointment here (or you can bring the meter itself).  You can write it on any piece of paper.  please call us sooner if your blood sugar goes below 70, or if most of your readings are over 200.   I have sent 2 prescription to your pharmacy: to double the Ozempic, and to change metformin to extended-release.  Please come back for a follow-up appointment in 2 months.

## 2021-01-04 ENCOUNTER — Other Ambulatory Visit: Payer: Self-pay

## 2021-01-04 DIAGNOSIS — R7309 Other abnormal glucose: Secondary | ICD-10-CM

## 2021-01-05 ENCOUNTER — Ambulatory Visit: Payer: BC Managed Care – PPO | Admitting: Surgery

## 2021-01-05 ENCOUNTER — Encounter: Payer: Self-pay | Admitting: Surgery

## 2021-01-05 ENCOUNTER — Other Ambulatory Visit: Payer: Self-pay

## 2021-01-05 VITALS — BP 117/81 | HR 88 | Temp 98.3°F | Resp 16 | Ht 70.0 in | Wt 173.0 lb

## 2021-01-05 DIAGNOSIS — K449 Diaphragmatic hernia without obstruction or gangrene: Secondary | ICD-10-CM

## 2021-01-05 DIAGNOSIS — E1369 Other specified diabetes mellitus with other specified complication: Secondary | ICD-10-CM | POA: Diagnosis not present

## 2021-01-05 LAB — FRUCTOSAMINE: Fructosamine: 322 umol/L — ABNORMAL HIGH (ref 0–285)

## 2021-01-05 NOTE — Patient Instructions (Addendum)
Our surgery scheduler Barbara will call you within 24-48 hours to get you scheduled. If you have not heard from her after 48 hours, please call our office. You will need to get Covid tested before surgery and have the blue sheet available when she calls to write down important information. ? ? ?If you have any concerns or questions, please feel free to call our office.  ? ?Hiatal Hernia ?A hiatal hernia occurs when part of the stomach slides above the muscle that separates the abdomen from the chest (diaphragm). A person can be born with a hiatal hernia (congenital), or it may develop over time. In almost all cases of hiatal hernia, only the top part of the stomach pushes through the diaphragm. ?Many people have a hiatal hernia with no symptoms. The larger the hernia, the more likely it is that you will have symptoms. In some cases, a hiatal hernia allows stomach acid to flow back into the tube that carries food from your mouth to your stomach (esophagus). This may cause heartburn symptoms. Severe heartburn symptoms may mean that you have developed a condition called gastroesophageal reflux disease (GERD). ?What are the causes? ?This condition is caused by a weakness in the opening (hiatus) where the esophagus passes through the diaphragm to attach to the upper part of the stomach. A person may be born with a weakness in the hiatus, or a weakness can develop over time. ?What increases the risk? ?This condition is more likely to develop in: ?Older people. Age is a major risk factor for a hiatal hernia, especially if you are over the age of 50. ?Pregnant women. ?People who are overweight. ?People who have frequent constipation. ?What are the signs or symptoms? ?Symptoms of this condition usually develop in the form of GERD symptoms. Symptoms include: ?Heartburn. ?Belching. ?Indigestion. ?Trouble swallowing. ?Coughing or wheezing. ?Sore throat. ?Hoarseness. ?Chest pain. ?Nausea and vomiting. ?How is this  diagnosed? ?This condition may be diagnosed during testing for GERD. Tests that may be done include: ?X-rays of your stomach or chest. ?An upper gastrointestinal (GI) series. This is an X-ray exam of your GI tract that is taken after you swallow a chalky liquid that shows up clearly on the X-ray. ?Endoscopy. This is a procedure to look into your stomach using a thin, flexible tube that has a tiny camera and light on the end of it. ?How is this treated? ?This condition may be treated by: ?Dietary and lifestyle changes to help reduce GERD symptoms. ?Medicines. These may include: ?Over-the-counter antacids. ?Medicines that make your stomach empty more quickly. ?Medicines that block the production of stomach acid (H2 blockers). ?Stronger medicines to reduce stomach acid (proton pump inhibitors). ?Surgery to repair the hernia, if other treatments are not helping. ?If you have no symptoms, you may not need treatment. ?Follow these instructions at home: ?Lifestyle and activity ?Do not use any products that contain nicotine or tobacco, such as cigarettes and e-cigarettes. If you need help quitting, ask your health care provider. ?Try to achieve and maintain a healthy body weight. ?Avoid putting pressure on your abdomen. Anything that puts pressure on your abdomen increases the amount of acid that may be pushed up into your esophagus. ?Avoid bending over, especially after eating. ?Raise the head of your bed by putting blocks under the legs. This keeps your head and esophagus higher than your stomach. ?Do not wear tight clothing around your chest or stomach. ?Try not to strain when having a bowel movement, when urinating, or when lifting   heavy objects. ?Eating and drinking ?Avoid foods that can worsen GERD symptoms. These may include: ?Fatty foods, like fried foods. ?Citrus fruits, like oranges or lemon. ?Other foods and drinks that contain acid, like orange juice or tomatoes. ?Spicy food. ?Chocolate. ?Eat frequent small  meals instead of three large meals a day. This helps prevent your stomach from getting too full. ?Eat slowly. ?Do not lie down right after eating. ?Do not eat 1-2 hours before bed. ?Do not drink beverages with caffeine. These include cola, coffee, cocoa, and tea. ?Do not drink alcohol. ?General instructions ?Take over-the-counter and prescription medicines only as told by your health care provider. ?Keep all follow-up visits as told by your health care provider. This is important. ?Contact a health care provider if: ?Your symptoms are not controlled with medicines or lifestyle changes. ?You are having trouble swallowing. ?You have coughing or wheezing that will not go away. ?Get help right away if: ?Your pain is getting worse. ?Your pain spreads to your arms, neck, jaw, teeth, or back. ?You have shortness of breath. ?You sweat for no reason. ?You feel sick to your stomach (nauseous) or you vomit. ?You vomit blood. ?You have bright red blood in your stools. ?You have black, tarry stools. ?Summary ?A hiatal hernia occurs when part of the stomach slides above the muscle that separates the abdomen from the chest (diaphragm). ?A person may be born with a weakness in the hiatus, or a weakness can develop over time. ?Symptoms of hiatal hernia may include heartburn, trouble swallowing, or sore throat. ?Management of hiatal hernia includes eating frequent small meals instead of three large meals a day. ?Get help right away if you vomit blood, have bright red blood in your stools, or have black, tarry stools. ?This information is not intended to replace advice given to you by your health care provider. Make sure you discuss any questions you have with your health care provider. ?Document Revised: 05/13/2020 Document Reviewed: 05/13/2020 ?Elsevier Patient Education ? 2022 [employer] ? ?

## 2021-01-06 ENCOUNTER — Telehealth: Payer: Self-pay | Admitting: Surgery

## 2021-01-06 NOTE — Telephone Encounter (Signed)
Patient has been advised of Pre-Admission date/time, COVID Testing date and Surgery date.  Surgery Date: 01/20/21 Preadmission Testing Date: 01/12/21 (phone 1p-5p) Covid Testing Date: 01/18/21@ 8:25 am  - patient [facility] to [facility] ([address] [facility])   Patient has been made aware to call [phone], between 1-3:00pm the day before surgery, to find out what time to arrive for surgery.

## 2021-01-06 NOTE — H&P (View-Only) (Signed)
Outpatient Surgical Follow Up  01/06/2021  Richard Lane is an 60 y.o. male.   Chief Complaint  Patient presents with   Follow-up    Discuss A1C and Surgery    HPI: Richard Lane is following up for paraesophageal hernia type III.  Continues to have some epigastric discomfort and reflux.  He denies any dysphagia.  He did see endocrinology and was placed in a morning test regimen.  Conversion of the last hemoglobin A1c is 8.  This continues to trend in the right direction.  I have again personally reviewed the barium swallow as well as a CT scan showing evidence of a moderate type III paraesophageal hernia.  No strictures or other surprising findings.  He has good cardiovascular reserve  Past Medical History:  Diagnosis Date   Alkaline phosphatase raised 06/09/2020   Atherosclerosis of native artery of both lower extremities with intermittent claudication (HCC)    Diverticulosis    Dizziness 05/07/2020   Dyslipidemia    Encounter for general adult medical examination with abnormal findings 06/09/2020   Fatty liver 06/09/2020   Generalized anxiety disorder 06/09/2020   Gouty arthropathy    Hypertriglyceridemia 05/07/2020   Mixed hyperlipidemia 05/07/2020   Other chest pain 05/07/2020   Transient neurologic deficit 11/2018   Type 2 diabetes mellitus with hypertriglyceridemia (HCC)    Type 2 diabetes mellitus without complication (HCC)    Urolith 06/09/2020    Past Surgical History:  Procedure Laterality Date   ESOPHAGOGASTRODUODENOSCOPY (EGD) WITH PROPOFOL N/A 12/14/2020   Procedure: ESOPHAGOGASTRODUODENOSCOPY (EGD) WITH PROPOFOL;  Surgeon: Midge Minium, MD;  Location: [facility] ENDOSCOPY;  Service: Endoscopy;  Laterality: N/A;   FOOT FRACTURE SURGERY Left     Family History  Problem Relation Age of Onset   Diabetes Mother    Alzheimer's disease Mother    Colon polyps Father     Social History:  reports that he has quit smoking. He has never used smokeless tobacco. He reports previous  alcohol use. He reports that he does not use drugs.  Allergies: No Known Allergies  Medications reviewed.    ROS Full ROS performed and is otherwise negative other than what is stated in HPI   BP 117/81   Pulse 88   Temp 98.3 F (36.8 C) (Oral)   Resp 16   Ht 5\' 10"  (1.778 m)   Wt 173 lb (78.5 kg)   SpO2 97%   BMI 24.82 kg/m   Physical Exam Vitals and nursing note reviewed. Exam conducted with a chaperone present.  Constitutional:      Appearance: Normal appearance. He is normal weight.  Eyes:     General: No scleral icterus.       Right eye: No discharge.        Left eye: No discharge.  Cardiovascular:     Rate and Rhythm: Normal rate and regular rhythm.     Heart sounds: No murmur heard. Pulmonary:     Effort: Pulmonary effort is normal.     Breath sounds: Normal breath sounds. No stridor.  Abdominal:     General: Abdomen is flat. There is no distension.     Palpations: Abdomen is soft. There is no mass.     Tenderness: There is no abdominal tenderness. There is no guarding or rebound.     Hernia: No hernia is present.  Musculoskeletal:        General: No swelling or tenderness. Normal range of motion.     Cervical back: Normal range of motion  and neck supple. No rigidity or tenderness.  Skin:    General: Skin is warm and dry.     Capillary Refill: Capillary refill takes less than 2 seconds.     Coloration: Skin is not jaundiced.  Neurological:     General: No focal deficit present.     Mental Status: He is alert and oriented to person, place, and time.  Psychiatric:        Mood and Affect: Mood normal.        Behavior: Behavior normal.        Thought Content: Thought content normal.        Judgment: Judgment normal.       Assessment/Plan: 60 year old male with symptomatic paraesophageal hernia type III.  He is very interested in surgical intervention to control reflux and fix the hernia.  He is diabetic and has done a dramatic improvement in his  hemoglobin.  His hemoglobin 1 AC now is down to 8 as compared to greater than 9.  He continues to be motivated with his diabetic control.  The regimen that he is currently on seems to be working very well after he was evaluated by endocrinology.  With that in mind I do think that in a few weeks he will go even lower and will be better controlled.  He wishes to schedule his surgery in a few weeks.  Prior to that we will recheck glycemia.  Procedure discussed with the patient and his wife in detail.  Risks, benefits and possible complications including but not limited to: Bleeding, infection esophageal bowel injuries, bloating, dysphagia.  I also discussed with him about fundoplication diet.  They are aware wished to proceed.  All the questions were answered   Greater than 50% of the 45 minutes  visit was spent in counseling/coordination of care   Richard Big, MD [facility] General Surgeon

## 2021-01-06 NOTE — Progress Notes (Signed)
Outpatient Surgical Follow Up  01/06/2021  Richard Lane is an 60 y.o. male.   Chief Complaint  Patient presents with   Follow-up    Discuss A1C and Surgery    HPI: [name] is following up for paraesophageal hernia type III.  Continues to have some epigastric discomfort and reflux.  He denies any dysphagia.  He did see endocrinology and was placed in a morning test regimen.  Conversion of the last hemoglobin A1c is 8.  This continues to trend in the right direction.  I have again personally reviewed the barium swallow as well as a CT scan showing evidence of a moderate type III paraesophageal hernia.  No strictures or other surprising findings.  He has good cardiovascular reserve  Past Medical History:  Diagnosis Date   Alkaline phosphatase raised 06/09/2020   Atherosclerosis of native artery of both lower extremities with intermittent claudication (HCC)    Diverticulosis    Dizziness 05/07/2020   Dyslipidemia    Encounter for general adult medical examination with abnormal findings 06/09/2020   Fatty liver 06/09/2020   Generalized anxiety disorder 06/09/2020   Gouty arthropathy    Hypertriglyceridemia 05/07/2020   Mixed hyperlipidemia 05/07/2020   Other chest pain 05/07/2020   Transient neurologic deficit 11/2018   Type 2 diabetes mellitus with hypertriglyceridemia (HCC)    Type 2 diabetes mellitus without complication (HCC)    Urolith 06/09/2020    Past Surgical History:  Procedure Laterality Date   ESOPHAGOGASTRODUODENOSCOPY (EGD) WITH PROPOFOL N/A 12/14/2020   Procedure: ESOPHAGOGASTRODUODENOSCOPY (EGD) WITH PROPOFOL;  Surgeon: Midge Minium, MD;  Location: [facility] ENDOSCOPY;  Service: Endoscopy;  Laterality: N/A;   FOOT FRACTURE SURGERY Left     Family History  Problem Relation Age of Onset   Diabetes Mother    Alzheimer's disease Mother    Colon polyps Father     Social History:  reports that he has quit smoking. He has never used smokeless tobacco. He reports previous  alcohol use. He reports that he does not use drugs.  Allergies: No Known Allergies  Medications reviewed.    ROS Full ROS performed and is otherwise negative other than what is stated in HPI   BP 117/81   Pulse 88   Temp 98.3 F (36.8 C) (Oral)   Resp 16   Ht 5\' 10"  (1.778 m)   Wt 173 lb (78.5 kg)   SpO2 97%   BMI 24.82 kg/m   Physical Exam Vitals and nursing note reviewed. Exam conducted with a chaperone present.  Constitutional:      Appearance: Normal appearance. He is normal weight.  Eyes:     General: No scleral icterus.       Right eye: No discharge.        Left eye: No discharge.  Cardiovascular:     Rate and Rhythm: Normal rate and regular rhythm.     Heart sounds: No murmur heard. Pulmonary:     Effort: Pulmonary effort is normal.     Breath sounds: Normal breath sounds. No stridor.  Abdominal:     General: Abdomen is flat. There is no distension.     Palpations: Abdomen is soft. There is no mass.     Tenderness: There is no abdominal tenderness. There is no guarding or rebound.     Hernia: No hernia is present.  Musculoskeletal:        General: No swelling or tenderness. Normal range of motion.     Cervical back: Normal range of motion  and neck supple. No rigidity or tenderness.  Skin:    General: Skin is warm and dry.     Capillary Refill: Capillary refill takes less than 2 seconds.     Coloration: Skin is not jaundiced.  Neurological:     General: No focal deficit present.     Mental Status: He is alert and oriented to person, place, and time.  Psychiatric:        Mood and Affect: Mood normal.        Behavior: Behavior normal.        Thought Content: Thought content normal.        Judgment: Judgment normal.       Assessment/Plan: 60-year-old male with symptomatic paraesophageal hernia type III.  He is very interested in surgical intervention to control reflux and fix the hernia.  He is diabetic and has done a dramatic improvement in his  hemoglobin.  His hemoglobin 1 AC now is down to 8 as compared to greater than 9.  He continues to be motivated with his diabetic control.  The regimen that he is currently on seems to be working very well after he was evaluated by endocrinology.  With that in mind I do think that in a few weeks he will go even lower and will be better controlled.  He wishes to schedule his surgery in a few weeks.  Prior to that we will recheck glycemia.  Procedure discussed with the patient and his wife in detail.  Risks, benefits and possible complications including but not limited to: Bleeding, infection esophageal bowel injuries, bloating, dysphagia.  I also discussed with him about fundoplication diet.  They are aware wished to proceed.  All the questions were answered   Greater than 50% of the 45 minutes  visit was spent in counseling/coordination of care   Benzion Mesta, MD FACS General Surgeon  

## 2021-01-12 ENCOUNTER — Other Ambulatory Visit
Admission: RE | Admit: 2021-01-12 | Discharge: 2021-01-12 | Disposition: A | Discharge status: Home / Self Care | Payer: BC Managed Care – PPO | Source: Ambulatory Visit | Attending: Surgery | Admitting: Surgery

## 2021-01-12 ENCOUNTER — Other Ambulatory Visit: Payer: Self-pay

## 2021-01-12 HISTORY — DX: Personal history of other diseases of the digestive system: Z87.19

## 2021-01-12 HISTORY — DX: Atherosclerotic heart disease of native coronary artery without angina pectoris: I25.10

## 2021-01-12 HISTORY — DX: Angina pectoris, unspecified: I20.9

## 2021-01-12 NOTE — Patient Instructions (Addendum)
Your procedure is scheduled on: 01/20/21 Thursday Report to the Registration Desk on the 1st floor of the [facility]. To find out your arrival time, please call [phone] between 1PM - 3PM on: 01/19/21 Wednesday Report to the [facility] on 01/18/21 at 8:30 am for Labs/EKG/Covid Test and Bag.  REMEMBER: Instructions that are not followed completely may result in serious medical risk, up to and including death; or upon the discretion of your surgeon and anesthesiologist your surgery may need to be rescheduled.  Do not eat food after midnight the night before surgery.  No gum chewing, lozengers or hard candies.  You may however, drink CLEAR liquids up to 2 hours before you are scheduled to arrive for your surgery. Do not drink anything within 2 hours of your scheduled arrival time. Type 1 and Type 2 diabetics should only drink water.  TAKE THESE MEDICATIONS THE MORNING OF SURGERY WITH A SIP OF WATER: NONE  Stop Metformin 2 days prior to surgery. DO NOT TAKE ON 07/26, 07/27, AND DO NOT TAKE ON THE DAY OF SURGERY.  One week prior to surgery: Stop Anti-inflammatories (NSAIDS) such as Advil, Aleve, Ibuprofen, Motrin, Naproxen, Naprosyn and Aspirin based products such as Excedrin, Goodys Powder, BC Powder.  Stop ANY OVER THE COUNTER supplements until after surgery.  You may  take Tylenol if needed for pain up until the day of surgery.  No Alcohol for 24 hours before or after surgery.  No Smoking including e-cigarettes for 24 hours prior to surgery.  No chewable tobacco products for at least 6 hours prior to surgery.  No nicotine patches on the day of surgery.  Do not use any "recreational" drugs for at least a week prior to your surgery.  Please be advised that the combination of cocaine and anesthesia may have negative outcomes, up to and including death. If you test positive for cocaine, your surgery will be cancelled.  On the morning of surgery brush your teeth with toothpaste  and water, you may rinse your mouth with mouthwash if you wish. Do not swallow any toothpaste or mouthwash.  Do not wear jewelry, make-up, hairpins, clips or nail polish.  Do not wear lotions, powders, or perfumes.   Do not shave body from the neck down 48 hours prior to surgery just in case you cut yourself which could leave a site for infection.  Also, freshly shaved skin may become irritated if using the CHG soap.  Contact lenses, hearing aids and dentures may not be worn into surgery.  Do not bring valuables to the hospital. [facility] is not responsible for any missing/lost belongings or valuables.   Notify your doctor if there is any change in your medical condition (cold, fever, infection).  Wear comfortable clothing (specific to your surgery type) to the hospital.  After surgery, you can help prevent lung complications by doing breathing exercises.  Take deep breaths and cough every 1-2 hours. Your doctor may order a device called an Incentive Spirometer to help you take deep breaths. When coughing or sneezing, hold a pillow firmly against your incision with both hands. This is called "splinting." Doing this helps protect your incision. It also decreases belly discomfort.  If you are being admitted to the hospital overnight, leave your suitcase in the car. After surgery it may be brought to your room.  If you are being discharged the day of surgery, you will not be allowed to drive home. You will need a responsible adult (18 years or  older) to drive you home and stay with you that night.   If you are taking public transportation, you will need to have a responsible adult (18 years or older) with you. Please confirm with your physician that it is acceptable to use public transportation.   Please call the Pre-admissions Testing Dept. at [phone] if you have any questions about these instructions.  Surgery Visitation Policy:  Patients undergoing a surgery or procedure  may have one family member or support person with them as long as that person is not COVID-19 positive or experiencing its symptoms.  That person may remain in the waiting area during the procedure.  Inpatient Visitation:    Visiting hours are 7 a.m. to 8 p.m. Inpatients will be allowed two visitors daily. The visitors may change each day during the patient's stay. No visitors under the age of 80. Any visitor under the age of 47 must be accompanied by an adult. The visitor must pass COVID-19 screenings, use hand sanitizer when entering and exiting the patient's room and wear a mask at all times, including in the patient's room. Patients must also wear a mask when staff or their visitor are in the room. Masking is required regardless of vaccination status.

## 2021-01-18 ENCOUNTER — Telehealth: Payer: Self-pay

## 2021-01-18 ENCOUNTER — Encounter
Admission: RE | Admit: 2021-01-18 | Discharge: 2021-01-18 | Disposition: A | Discharge status: Home / Self Care | Payer: BC Managed Care – PPO | Source: Ambulatory Visit | Attending: Surgery | Admitting: Surgery

## 2021-01-18 ENCOUNTER — Other Ambulatory Visit: Payer: Self-pay

## 2021-01-18 DIAGNOSIS — Z01818 Encounter for other preprocedural examination: Secondary | ICD-10-CM | POA: Insufficient documentation

## 2021-01-18 DIAGNOSIS — Z20822 Contact with and (suspected) exposure to covid-19: Secondary | ICD-10-CM | POA: Insufficient documentation

## 2021-01-18 LAB — COMPREHENSIVE METABOLIC PANEL
ALT: 25 U/L (ref 0–44)
AST: 19 U/L (ref 15–41)
Albumin: 4.2 g/dL (ref 3.5–5.0)
Alkaline Phosphatase: 88 U/L (ref 38–126)
Anion gap: 7 (ref 5–15)
BUN: 13 mg/dL (ref 6–20)
CO2: 25 mmol/L (ref 22–32)
Calcium: 9.2 mg/dL (ref 8.9–10.3)
Chloride: 108 mmol/L (ref 98–111)
Creatinine, Ser: 0.6 mg/dL — ABNORMAL LOW (ref 0.61–1.24)
GFR, Estimated: >60 mL/min (ref >60)
Glucose, Bld: 156 mg/dL — ABNORMAL HIGH (ref 70–99)
Potassium: 3.9 mmol/L (ref 3.5–5.1)
Sodium: 140 mmol/L (ref 135–145)
Total Bilirubin: 0.7 mg/dL (ref 0.3–1.2)
Total Protein: 7.4 g/dL (ref 6.5–8.1)

## 2021-01-18 LAB — CBC
HCT: 37.2 % — ABNORMAL LOW (ref 39.0–52.0)
Hemoglobin: 13.4 g/dL (ref 13.0–17.0)
MCH: 32.4 pg (ref 26.0–34.0)
MCHC: 36 g/dL (ref 30.0–36.0)
MCV: 90.1 fL (ref 80.0–100.0)
Platelets: 250 10*3/uL (ref 150–400)
RBC: 4.13 MIL/uL — ABNORMAL LOW (ref 4.22–5.81)
RDW: 11.9 % (ref 11.5–15.5)
WBC: 6.6 10*3/uL (ref 4.0–10.5)
nRBC: 0 % (ref 0.0–0.2)

## 2021-01-18 LAB — SARS CORONAVIRUS 2 (TAT 6-24 HRS): SARS Coronavirus 2: NEGATIVE

## 2021-01-18 NOTE — Telephone Encounter (Signed)
Left message stating lab work normal. Proceed with surgery.

## 2021-01-20 ENCOUNTER — Observation Stay: Payer: BC Managed Care – PPO

## 2021-01-20 ENCOUNTER — Ambulatory Visit: Payer: BC Managed Care – PPO | Admitting: Certified Registered"

## 2021-01-20 ENCOUNTER — Encounter: Payer: Self-pay | Admitting: Surgery

## 2021-01-20 ENCOUNTER — Other Ambulatory Visit: Payer: Self-pay

## 2021-01-20 ENCOUNTER — Encounter
Admission: RE | Disposition: A | Discharge status: Home / Self Care | Payer: Self-pay | Source: Home / Self Care | Attending: Surgery

## 2021-01-20 ENCOUNTER — Observation Stay
Admission: RE | Admit: 2021-01-20 | Discharge: 2021-01-21 | Disposition: A | Discharge status: Home / Self Care | Payer: BC Managed Care – PPO | Attending: Surgery | Admitting: Surgery

## 2021-01-20 DIAGNOSIS — Z87891 Personal history of nicotine dependence: Secondary | ICD-10-CM | POA: Insufficient documentation

## 2021-01-20 DIAGNOSIS — K219 Gastro-esophageal reflux disease without esophagitis: Secondary | ICD-10-CM | POA: Insufficient documentation

## 2021-01-20 DIAGNOSIS — K449 Diaphragmatic hernia without obstruction or gangrene: Principal | ICD-10-CM | POA: Insufficient documentation

## 2021-01-20 DIAGNOSIS — I2510 Atherosclerotic heart disease of native coronary artery without angina pectoris: Secondary | ICD-10-CM | POA: Diagnosis not present

## 2021-01-20 DIAGNOSIS — E119 Type 2 diabetes mellitus without complications: Secondary | ICD-10-CM | POA: Insufficient documentation

## 2021-01-20 DIAGNOSIS — R918 Other nonspecific abnormal finding of lung field: Secondary | ICD-10-CM | POA: Diagnosis not present

## 2021-01-20 DIAGNOSIS — J939 Pneumothorax, unspecified: Secondary | ICD-10-CM | POA: Diagnosis not present

## 2021-01-20 DIAGNOSIS — Z9889 Other specified postprocedural states: Secondary | ICD-10-CM

## 2021-01-20 DIAGNOSIS — J9811 Atelectasis: Secondary | ICD-10-CM | POA: Diagnosis not present

## 2021-01-20 DIAGNOSIS — E782 Mixed hyperlipidemia: Secondary | ICD-10-CM | POA: Diagnosis not present

## 2021-01-20 DIAGNOSIS — Z01810 Encounter for preprocedural cardiovascular examination: Secondary | ICD-10-CM | POA: Diagnosis not present

## 2021-01-20 DIAGNOSIS — R0600 Dyspnea, unspecified: Secondary | ICD-10-CM

## 2021-01-20 DIAGNOSIS — I517 Cardiomegaly: Secondary | ICD-10-CM | POA: Diagnosis not present

## 2021-01-20 DIAGNOSIS — Z8719 Personal history of other diseases of the digestive system: Secondary | ICD-10-CM

## 2021-01-20 HISTORY — PX: XI ROBOTIC ASSISTED PARAESOPHAGEAL HERNIA REPAIR: SHX6871

## 2021-01-20 LAB — GLUCOSE, CAPILLARY
Glucose-Capillary: 126 mg/dL — ABNORMAL HIGH (ref 70–99)
Glucose-Capillary: 263 mg/dL — ABNORMAL HIGH (ref 70–99)
Glucose-Capillary: 181 mg/dL — ABNORMAL HIGH (ref 70–99)
Glucose-Capillary: 156 mg/dL — ABNORMAL HIGH (ref 70–99)

## 2021-01-20 LAB — CBC
HCT: 38.5 % — ABNORMAL LOW (ref 39.0–52.0)
Hemoglobin: 13.7 g/dL (ref 13.0–17.0)
MCH: 33 pg (ref 26.0–34.0)
MCHC: 35.6 g/dL (ref 30.0–36.0)
MCV: 92.8 fL (ref 80.0–100.0)
Platelets: 260 10*3/uL (ref 150–400)
RBC: 4.15 MIL/uL — ABNORMAL LOW (ref 4.22–5.81)
RDW: 12 % (ref 11.5–15.5)
WBC: 13.4 10*3/uL — ABNORMAL HIGH (ref 4.0–10.5)
nRBC: 0 % (ref 0.0–0.2)

## 2021-01-20 LAB — POCT I-STAT, CHEM 8
BUN: 12 mg/dL (ref 6–20)
Calcium, Ion: 1.21 mmol/L (ref 1.15–1.40)
Chloride: 108 mmol/L (ref 98–111)
Creatinine, Ser: 0.5 mg/dL — ABNORMAL LOW (ref 0.61–1.24)
Glucose, Bld: 130 mg/dL — ABNORMAL HIGH (ref 70–99)
HCT: 37 % — ABNORMAL LOW (ref 39.0–52.0)
Hemoglobin: 12.6 g/dL — ABNORMAL LOW (ref 13.0–17.0)
Potassium: 4.4 mmol/L (ref 3.5–5.1)
Sodium: 142 mmol/L (ref 135–145)
TCO2: 21 mmol/L — ABNORMAL LOW (ref 22–32)

## 2021-01-20 LAB — CREATININE, SERUM
Creatinine, Ser: 0.76 mg/dL (ref 0.61–1.24)
GFR, Estimated: >60 mL/min (ref >60)

## 2021-01-20 SURGERY — REPAIR, HERNIA, PARAESOPHAGEAL, ROBOT-ASSISTED
Anesthesia: General

## 2021-01-20 MED ORDER — GABAPENTIN 300 MG PO CAPS
ORAL_CAPSULE | ORAL | Status: AC
  Administered 2021-01-20: 300 mg via ORAL
  Filled 2021-01-20: qty 1

## 2021-01-20 MED ORDER — MIDAZOLAM HCL 2 MG/2ML IJ SOLN
INTRAMUSCULAR | Status: DC | PRN
  Administered 2021-01-20: 2 mg via INTRAVENOUS

## 2021-01-20 MED ORDER — HYDRALAZINE HCL 20 MG/ML IJ SOLN
10.0000 mg | INTRAMUSCULAR | Status: DC | PRN
  Administered 2021-01-20: 10 mg via INTRAVENOUS
  Filled 2021-01-20: qty 1

## 2021-01-20 MED ORDER — LABETALOL HCL 5 MG/ML IV SOLN
INTRAVENOUS | Status: AC
  Filled 2021-01-20: qty 4

## 2021-01-20 MED ORDER — INSULIN ASPART 100 UNIT/ML IJ SOLN
0.0000 [IU] | Freq: Three times a day (TID) | INTRAMUSCULAR | Status: DC
  Administered 2021-01-20 – 2021-01-21 (×2): 3 [IU] via SUBCUTANEOUS
  Filled 2021-01-20 (×2): qty 1

## 2021-01-20 MED ORDER — PHENYLEPHRINE HCL (PRESSORS) 10 MG/ML IV SOLN
INTRAVENOUS | Status: DC | PRN
  Administered 2021-01-20 (×3): 100 ug via INTRAVENOUS

## 2021-01-20 MED ORDER — FAMOTIDINE 20 MG PO TABS
ORAL_TABLET | ORAL | Status: AC
  Administered 2021-01-20: 20 mg via ORAL
  Filled 2021-01-20: qty 1

## 2021-01-20 MED ORDER — SODIUM CHLORIDE 0.9 % IV SOLN: INTRAVENOUS | Status: DC

## 2021-01-20 MED ORDER — CELECOXIB 200 MG PO CAPS
ORAL_CAPSULE | ORAL | Status: AC
  Administered 2021-01-20: 200 mg via ORAL
  Filled 2021-01-20: qty 1

## 2021-01-20 MED ORDER — INSULIN ASPART 100 UNIT/ML IJ SOLN
INTRAMUSCULAR | Status: AC
  Administered 2021-01-20: 6 [IU]
  Filled 2021-01-20: qty 1

## 2021-01-20 MED ORDER — ORAL CARE MOUTH RINSE: 15.0000 mL | Freq: Once | OROMUCOSAL | Status: AC

## 2021-01-20 MED ORDER — LACTATED RINGERS IV SOLN: INTRAVENOUS | Status: DC | PRN

## 2021-01-20 MED ORDER — IOHEXOL 300 MG/ML  SOLN
175.0000 mL | Freq: Once | INTRAMUSCULAR | Status: AC | PRN
  Administered 2021-01-20: 175 mL via ORAL

## 2021-01-20 MED ORDER — HEPARIN SODIUM (PORCINE) 5000 UNIT/ML IJ SOLN: 5000.0000 [IU] | Freq: Once | INTRAMUSCULAR | Status: AC

## 2021-01-20 MED ORDER — PROCHLORPERAZINE EDISYLATE 10 MG/2ML IJ SOLN: 5.0000 mg | Freq: Four times a day (QID) | INTRAMUSCULAR | Status: DC | PRN

## 2021-01-20 MED ORDER — FENTANYL CITRATE (PF) 100 MCG/2ML IJ SOLN
INTRAMUSCULAR | Status: AC
  Filled 2021-01-20: qty 2

## 2021-01-20 MED ORDER — ONDANSETRON 4 MG PO TBDP: 4.0000 mg | ORAL_TABLET | Freq: Four times a day (QID) | ORAL | Status: DC | PRN

## 2021-01-20 MED ORDER — FENTANYL CITRATE (PF) 100 MCG/2ML IJ SOLN
INTRAMUSCULAR | Status: DC | PRN
  Administered 2021-01-20 (×4): 50 ug via INTRAVENOUS
  Administered 2021-01-20: 100 ug via INTRAVENOUS

## 2021-01-20 MED ORDER — INSULIN ASPART 100 UNIT/ML IJ SOLN: 0.0000 [IU] | Freq: Every day | INTRAMUSCULAR | Status: DC

## 2021-01-20 MED ORDER — FAMOTIDINE 20 MG PO TABS: 20.0000 mg | ORAL_TABLET | Freq: Once | ORAL | Status: AC

## 2021-01-20 MED ORDER — LIDOCAINE-EPINEPHRINE 1 %-1:100000 IJ SOLN
INTRAMUSCULAR | Status: AC
  Filled 2021-01-20: qty 1

## 2021-01-20 MED ORDER — CHLORHEXIDINE GLUCONATE 0.12 % MT SOLN
OROMUCOSAL | Status: AC
  Administered 2021-01-20: 15 mL via OROMUCOSAL
  Filled 2021-01-20: qty 15

## 2021-01-20 MED ORDER — OXYCODONE HCL 5 MG PO TABS
5.0000 mg | ORAL_TABLET | ORAL | Status: DC | PRN
  Administered 2021-01-20: 5 mg via ORAL
  Administered 2021-01-21: 10 mg via ORAL
  Filled 2021-01-20: qty 1
  Filled 2021-01-20: qty 2

## 2021-01-20 MED ORDER — PROPOFOL 10 MG/ML IV BOLUS
INTRAVENOUS | Status: AC
  Filled 2021-01-20: qty 20

## 2021-01-20 MED ORDER — FENTANYL CITRATE (PF) 100 MCG/2ML IJ SOLN
INTRAMUSCULAR | Status: AC
  Administered 2021-01-20: 25 ug via INTRAVENOUS
  Filled 2021-01-20: qty 2

## 2021-01-20 MED ORDER — PROMETHAZINE HCL 25 MG/ML IJ SOLN: 6.2500 mg | INTRAMUSCULAR | Status: DC | PRN

## 2021-01-20 MED ORDER — ACETAMINOPHEN 500 MG PO TABS: 1000.0000 mg | ORAL_TABLET | ORAL | Status: AC

## 2021-01-20 MED ORDER — GABAPENTIN 300 MG PO CAPS: 300.0000 mg | ORAL_CAPSULE | ORAL | Status: AC

## 2021-01-20 MED ORDER — CHLORHEXIDINE GLUCONATE CLOTH 2 % EX PADS
6.0000 | MEDICATED_PAD | Freq: Once | CUTANEOUS | Status: DC
  Administered 2021-01-20: 6 via TOPICAL

## 2021-01-20 MED ORDER — SODIUM CHLORIDE FLUSH 0.9 % IV SOLN
INTRAVENOUS | Status: AC
  Filled 2021-01-20: qty 20

## 2021-01-20 MED ORDER — BUPIVACAINE-EPINEPHRINE 0.25% -1:200000 IJ SOLN
INTRAMUSCULAR | Status: DC | PRN
  Administered 2021-01-20: 30 mL

## 2021-01-20 MED ORDER — CEFAZOLIN SODIUM-DEXTROSE 2-4 GM/100ML-% IV SOLN
2.0000 g | Freq: Three times a day (TID) | INTRAVENOUS | Status: AC
  Administered 2021-01-20 – 2021-01-21 (×3): 2 g via INTRAVENOUS
  Filled 2021-01-20 (×3): qty 100

## 2021-01-20 MED ORDER — METOPROLOL TARTRATE 5 MG/5ML IV SOLN: 5.0000 mg | Freq: Four times a day (QID) | INTRAVENOUS | Status: DC | PRN

## 2021-01-20 MED ORDER — LIDOCAINE HCL (CARDIAC) PF 100 MG/5ML IV SOSY
PREFILLED_SYRINGE | INTRAVENOUS | Status: DC | PRN
  Administered 2021-01-20: 50 mg via INTRAVENOUS

## 2021-01-20 MED ORDER — ROCURONIUM BROMIDE 100 MG/10ML IV SOLN
INTRAVENOUS | Status: DC | PRN
  Administered 2021-01-20: 20 mg via INTRAVENOUS
  Administered 2021-01-20: 10 mg via INTRAVENOUS
  Administered 2021-01-20: 20 mg via INTRAVENOUS
  Administered 2021-01-20: 50 mg via INTRAVENOUS

## 2021-01-20 MED ORDER — INSULIN ASPART 100 UNIT/ML IJ SOLN
6.0000 [IU] | Freq: Once | INTRAMUSCULAR | Status: AC
  Administered 2021-01-20: 6 [IU] via SUBCUTANEOUS

## 2021-01-20 MED ORDER — ENOXAPARIN SODIUM 40 MG/0.4ML IJ SOSY
40.0000 mg | PREFILLED_SYRINGE | INTRAMUSCULAR | Status: DC
  Administered 2021-01-21: 40 mg via SUBCUTANEOUS
  Filled 2021-01-20: qty 0.4

## 2021-01-20 MED ORDER — SUGAMMADEX SODIUM 200 MG/2ML IV SOLN
INTRAVENOUS | Status: DC | PRN
  Administered 2021-01-20: 200 mg via INTRAVENOUS

## 2021-01-20 MED ORDER — MIDAZOLAM HCL 2 MG/2ML IJ SOLN
INTRAMUSCULAR | Status: AC
  Filled 2021-01-20: qty 2

## 2021-01-20 MED ORDER — INSULIN ASPART 100 UNIT/ML IJ SOLN
4.0000 [IU] | Freq: Three times a day (TID) | INTRAMUSCULAR | Status: DC
  Administered 2021-01-21: 4 [IU] via SUBCUTANEOUS
  Filled 2021-01-20: qty 1

## 2021-01-20 MED ORDER — CELECOXIB 200 MG PO CAPS: 200.0000 mg | ORAL_CAPSULE | ORAL | Status: AC

## 2021-01-20 MED ORDER — PROPOFOL 10 MG/ML IV BOLUS
INTRAVENOUS | Status: DC | PRN
  Administered 2021-01-20: 150 mg via INTRAVENOUS

## 2021-01-20 MED ORDER — SODIUM CHLORIDE 0.9 % IV SOLN
INTRAVENOUS | Status: DC | PRN
  Administered 2021-01-20: 30 ug/min via INTRAVENOUS

## 2021-01-20 MED ORDER — KETAMINE HCL 50 MG/ML IJ SOLN
INTRAMUSCULAR | Status: AC
  Filled 2021-01-20: qty 1

## 2021-01-20 MED ORDER — SEVOFLURANE IN SOLN
RESPIRATORY_TRACT | Status: AC
  Filled 2021-01-20: qty 250

## 2021-01-20 MED ORDER — ACETAMINOPHEN 500 MG PO TABS
1000.0000 mg | ORAL_TABLET | Freq: Four times a day (QID) | ORAL | Status: DC
  Administered 2021-01-20 – 2021-01-21 (×3): 1000 mg via ORAL
  Filled 2021-01-20 (×3): qty 2

## 2021-01-20 MED ORDER — PROCHLORPERAZINE MALEATE 10 MG PO TABS
10.0000 mg | ORAL_TABLET | Freq: Four times a day (QID) | ORAL | Status: DC | PRN
  Filled 2021-01-20: qty 1

## 2021-01-20 MED ORDER — KETOROLAC TROMETHAMINE 30 MG/ML IJ SOLN
30.0000 mg | Freq: Four times a day (QID) | INTRAMUSCULAR | Status: DC
  Administered 2021-01-20 – 2021-01-21 (×3): 30 mg via INTRAVENOUS
  Filled 2021-01-20 (×3): qty 1

## 2021-01-20 MED ORDER — VISTASEAL 10 ML SINGLE DOSE KIT
PACK | CUTANEOUS | Status: AC
  Filled 2021-01-20: qty 10

## 2021-01-20 MED ORDER — DEXAMETHASONE SODIUM PHOSPHATE 10 MG/ML IJ SOLN
INTRAMUSCULAR | Status: AC
  Filled 2021-01-20: qty 1

## 2021-01-20 MED ORDER — VISTASEAL 10 ML SINGLE DOSE KIT
PACK | CUTANEOUS | Status: DC | PRN
Start: 2021-01-20 — End: 2021-01-20
  Administered 2021-01-20: 10 mL via TOPICAL

## 2021-01-20 MED ORDER — PREGABALIN 50 MG PO CAPS
100.0000 mg | ORAL_CAPSULE | Freq: Three times a day (TID) | ORAL | Status: DC
  Filled 2021-01-20 (×2): qty 2

## 2021-01-20 MED ORDER — ONDANSETRON HCL 4 MG/2ML IJ SOLN
INTRAMUSCULAR | Status: DC | PRN
  Administered 2021-01-20: 4 mg via INTRAVENOUS

## 2021-01-20 MED ORDER — FENTANYL CITRATE (PF) 100 MCG/2ML IJ SOLN
25.0000 ug | INTRAMUSCULAR | Status: AC | PRN
  Administered 2021-01-20 (×4): 25 ug via INTRAVENOUS

## 2021-01-20 MED ORDER — KETAMINE HCL 10 MG/ML IJ SOLN
INTRAMUSCULAR | Status: DC | PRN
  Administered 2021-01-20: 20 mg via INTRAVENOUS
  Administered 2021-01-20: 30 mg via INTRAVENOUS

## 2021-01-20 MED ORDER — MORPHINE SULFATE (PF) 2 MG/ML IV SOLN
2.0000 mg | INTRAVENOUS | Status: DC | PRN
  Filled 2021-01-20: qty 1

## 2021-01-20 MED ORDER — CEFAZOLIN SODIUM-DEXTROSE 2-4 GM/100ML-% IV SOLN
2.0000 g | INTRAVENOUS | Status: AC
  Administered 2021-01-20: 2 g via INTRAVENOUS

## 2021-01-20 MED ORDER — CHLORHEXIDINE GLUCONATE 0.12 % MT SOLN: 15.0000 mL | Freq: Once | OROMUCOSAL | Status: AC

## 2021-01-20 MED ORDER — BUPIVACAINE LIPOSOME 1.3 % IJ SUSP
INTRAMUSCULAR | Status: DC | PRN
  Administered 2021-01-20: 20 mL

## 2021-01-20 MED ORDER — CEFAZOLIN SODIUM-DEXTROSE 2-4 GM/100ML-% IV SOLN
INTRAVENOUS | Status: AC
  Filled 2021-01-20: qty 100

## 2021-01-20 MED ORDER — FENTANYL CITRATE (PF) 100 MCG/2ML IJ SOLN
25.0000 ug | INTRAMUSCULAR | Status: AC | PRN
  Administered 2021-01-20 (×2): 25 ug via INTRAVENOUS

## 2021-01-20 MED ORDER — HEPARIN SODIUM (PORCINE) 5000 UNIT/ML IJ SOLN
INTRAMUSCULAR | Status: AC
  Administered 2021-01-20: 5000 [IU] via SUBCUTANEOUS
  Filled 2021-01-20: qty 1

## 2021-01-20 MED ORDER — BUPIVACAINE-EPINEPHRINE (PF) 0.25% -1:200000 IJ SOLN
INTRAMUSCULAR | Status: AC
  Filled 2021-01-20: qty 30

## 2021-01-20 MED ORDER — LABETALOL HCL 5 MG/ML IV SOLN: 10.0000 mg | INTRAVENOUS | Status: DC | PRN

## 2021-01-20 MED ORDER — BUPIVACAINE LIPOSOME 1.3 % IJ SUSP
INTRAMUSCULAR | Status: AC
  Filled 2021-01-20: qty 20

## 2021-01-20 MED ORDER — LABETALOL HCL 5 MG/ML IV SOLN
10.0000 mg | Freq: Once | INTRAVENOUS | Status: AC
  Administered 2021-01-20: 10 mg via INTRAVENOUS

## 2021-01-20 MED ORDER — DEXAMETHASONE SODIUM PHOSPHATE 10 MG/ML IJ SOLN
INTRAMUSCULAR | Status: DC | PRN
Start: 2021-01-20 — End: 2021-01-20
  Administered 2021-01-20: 5 mg via INTRAVENOUS

## 2021-01-20 MED ORDER — MORPHINE SULFATE (PF) 2 MG/ML IV SOLN
2.0000 mg | INTRAVENOUS | Status: DC | PRN
  Administered 2021-01-20: 4 mg via INTRAVENOUS
  Administered 2021-01-20: 2 mg via INTRAVENOUS
  Filled 2021-01-20: qty 2

## 2021-01-20 MED ORDER — DEXMEDETOMIDINE (PRECEDEX) IN NS 20 MCG/5ML (4 MCG/ML) IV SYRINGE
PREFILLED_SYRINGE | INTRAVENOUS | Status: DC | PRN
  Administered 2021-01-20: 8 ug via INTRAVENOUS
  Administered 2021-01-20: 4 ug via INTRAVENOUS
  Administered 2021-01-20: 8 ug via INTRAVENOUS

## 2021-01-20 MED ORDER — ONDANSETRON HCL 4 MG/2ML IJ SOLN: 4.0000 mg | Freq: Four times a day (QID) | INTRAMUSCULAR | Status: DC | PRN

## 2021-01-20 MED ORDER — ACETAMINOPHEN 500 MG PO TABS
ORAL_TABLET | ORAL | Status: AC
  Administered 2021-01-20: 1000 mg via ORAL
  Filled 2021-01-20: qty 2

## 2021-01-20 MED ORDER — ALPRAZOLAM 0.5 MG PO TABS
0.5000 mg | ORAL_TABLET | Freq: Three times a day (TID) | ORAL | Status: DC | PRN
  Administered 2021-01-20: 0.5 mg via ORAL
  Filled 2021-01-20: qty 1

## 2021-01-20 SURGICAL SUPPLY — 60 items
APPLICATOR VISTASEAL 35 (MISCELLANEOUS) ×2 IMPLANT
CANISTER SUCT 1200ML W/VALVE (MISCELLANEOUS) IMPLANT
CANNULA REDUC XI 12-8 STAPL (CANNULA) ×1
CANNULA REDUCER 12-8 DVNC XI (CANNULA) ×1 IMPLANT
CHLORAPREP W/TINT 26 (MISCELLANEOUS) ×2 IMPLANT
DECANTER SPIKE VIAL GLASS SM (MISCELLANEOUS) ×2 IMPLANT
DEFOGGER SCOPE WARMER CLEARIFY (MISCELLANEOUS) ×2 IMPLANT
DERMABOND ADVANCED (GAUZE/BANDAGES/DRESSINGS) ×1
DERMABOND ADVANCED .7 DNX12 (GAUZE/BANDAGES/DRESSINGS) ×1 IMPLANT
DRAPE 3/4 80X56 (DRAPES) IMPLANT
DRAPE ARM DVNC X/XI (DISPOSABLE) ×4 IMPLANT
DRAPE COLUMN DVNC XI (DISPOSABLE) ×1 IMPLANT
DRAPE DA VINCI XI ARM (DISPOSABLE) ×4
DRAPE DA VINCI XI COLUMN (DISPOSABLE) ×1
ELECT CAUTERY BLADE 6.4 (BLADE) ×2 IMPLANT
ELECT REM PT RETURN 9FT ADLT (ELECTROSURGICAL) ×2
ELECTRODE REM PT RTRN 9FT ADLT (ELECTROSURGICAL) ×1 IMPLANT
GAUZE 4X4 16PLY ~~LOC~~+RFID DBL (SPONGE) IMPLANT
GLOVE SURG ENC MOIS LTX SZ7 (GLOVE) ×6 IMPLANT
GOWN STRL REUS W/ TWL LRG LVL3 (GOWN DISPOSABLE) ×4 IMPLANT
GOWN STRL REUS W/TWL LRG LVL3 (GOWN DISPOSABLE) ×4
GRASPER LAPSCPC 5X45 DSP (INSTRUMENTS) ×2 IMPLANT
IRRIGATION STRYKERFLOW (MISCELLANEOUS) ×1 IMPLANT
IRRIGATOR STRYKERFLOW (MISCELLANEOUS) ×2
IV NS 1000ML (IV SOLUTION)
IV NS 1000ML BAXH (IV SOLUTION) IMPLANT
KIT PINK PAD W/HEAD ARE REST (MISCELLANEOUS) ×2
KIT PINK PAD W/HEAD ARM REST (MISCELLANEOUS) ×1 IMPLANT
KIT TURNOVER CYSTO (KITS) ×2 IMPLANT
LABEL OR SOLS (LABEL) ×2 IMPLANT
MANIFOLD NEPTUNE II (INSTRUMENTS) ×2 IMPLANT
MESH BIO-A 7X10 SYN MAT (Mesh General) ×2 IMPLANT
NEEDLE HYPO 22GX1.5 SAFETY (NEEDLE) ×2 IMPLANT
OBTURATOR OPTICAL STANDARD 8MM (TROCAR) ×1
OBTURATOR OPTICAL STND 8 DVNC (TROCAR) ×1
OBTURATOR OPTICALSTD 8 DVNC (TROCAR) ×1 IMPLANT
PACK LAP CHOLECYSTECTOMY (MISCELLANEOUS) ×2 IMPLANT
PENCIL ELECTRO HAND CTR (MISCELLANEOUS) ×2 IMPLANT
POUCH SPECIMEN RETRIEVAL 10MM (ENDOMECHANICALS) ×2 IMPLANT
SEAL CANN UNIV 5-8 DVNC XI (MISCELLANEOUS) ×3 IMPLANT
SEAL XI 5MM-8MM UNIVERSAL (MISCELLANEOUS) ×3
SEALER VESSEL DA VINCI XI (MISCELLANEOUS) ×1
SEALER VESSEL EXT DVNC XI (MISCELLANEOUS) ×1 IMPLANT
SOLUTION ELECTROLUBE (MISCELLANEOUS) ×2 IMPLANT
SPONGE T-LAP 18X18 ~~LOC~~+RFID (SPONGE) ×2 IMPLANT
STAPLER CANNULA SEAL DVNC XI (STAPLE) ×1 IMPLANT
STAPLER CANNULA SEAL XI (STAPLE) ×1
SUT MNCRL 4-0 (SUTURE) ×1
SUT MNCRL 4-0 27XMFL (SUTURE) ×1
SUT SILK 2 0 SH (SUTURE) ×6 IMPLANT
SUT VICRYL 0 AB UR-6 (SUTURE) ×4 IMPLANT
SUT VLOC 90 S/L VL9 GS22 (SUTURE) ×2 IMPLANT
SUTURE MNCRL 4-0 27XMF (SUTURE) ×1 IMPLANT
SYR 20ML LL LF (SYRINGE) ×2 IMPLANT
SYR 30ML LL (SYRINGE) ×2 IMPLANT
TAPE TRANSPORE STRL 2 31045 (GAUZE/BANDAGES/DRESSINGS) ×2 IMPLANT
TRAY FOLEY SLVR 16FR LF STAT (SET/KITS/TRAYS/PACK) ×2 IMPLANT
TROCAR BALLN GELPORT 12X130M (ENDOMECHANICALS) ×2 IMPLANT
TROCAR XCEL NON-BLD 5MMX100MML (ENDOMECHANICALS) ×2 IMPLANT
TUBING EVAC SMOKE HEATED PNEUM (TUBING) ×2 IMPLANT

## 2021-01-20 NOTE — Anesthesia Preprocedure Evaluation (Signed)
Anesthesia Evaluation  Patient identified by MRN, date of birth, ID band Patient awake    Reviewed: Allergy & Precautions, NPO status , Patient's Chart, lab work & pertinent test results  History of Anesthesia Complications Negative for: history of anesthetic complications  Airway Mallampati: I  TM Distance: >3 FB Neck ROM: Full    Dental  (+) Implants, Dental Advidsory Given, Teeth Intact   Pulmonary neg pulmonary ROS, former smoker,    Pulmonary exam normal breath sounds clear to auscultation       Cardiovascular Exercise Tolerance: Good hypertension, (-) angina+ CAD and + Peripheral Vascular Disease  (-) Past MI (-) dysrhythmias (-) Valvular Problems/Murmurs Rhythm:Regular Rate:Normal   From cardiology note: "60 y.o. male with a hx of coronary artery disease seen on coronary CTA, nonsustained ventricular tachycardia, and atrial tachycardia is now on metoprolol,dyslipidemia, type 2 diabetes, history of hypertriglyceridemia.  He tells me since I last saw him he has been doing well from a cardiovascular standpoint.  He reports that the metoprolol since started his palpitations have resolved.  He denies any chest pain or shortness of breath."   Neuro/Psych PSYCHIATRIC DISORDERS Anxiety negative neurological ROS     GI/Hepatic Neg liver ROS, hiatal hernia, neg GERD  ,  Endo/Other  diabetes  Renal/GU negative Renal ROS  negative genitourinary   Musculoskeletal  (+) Arthritis ,   Abdominal (+) - obese (BMI 25),   Peds negative pediatric ROS (+)  Hematology negative hematology ROS (+)   Anesthesia Other Findings Past Medical History: 06/09/2020: Alkaline phosphatase raised No date: Anginal pain (HCC) No date: Atherosclerosis of native artery of both lower extremities  with intermittent claudication (HCC) No date: Coronary artery disease No date: Diverticulosis 05/07/2020: Dizziness No date:  Dyslipidemia 06/09/2020: Encounter for general adult medical examination with  abnormal findings 06/09/2020: Fatty liver 06/09/2020: Generalized anxiety disorder No date: Gouty arthropathy No date: History of hiatal hernia 05/07/2020: Hypertriglyceridemia 05/07/2020: Mixed hyperlipidemia 05/07/2020: Other chest pain 11/2018: Transient neurologic deficit No date: Type 2 diabetes mellitus with hypertriglyceridemia (HCC) No date: Type 2 diabetes mellitus without complication (HCC) 06/09/2020: Urolith   Reproductive/Obstetrics negative OB ROS                             Anesthesia Physical  Anesthesia Plan  ASA: 3  Anesthesia Plan: General   Post-op Pain Management:    Induction: Intravenous  PONV Risk Score and Plan: Ondansetron, Dexamethasone, Midazolam, Promethazine and Treatment may vary due to age or medical condition  Airway Management Planned: Oral ETT  Additional Equipment: None  Intra-op Plan:   Post-operative Plan: Extubation in OR  Informed Consent: I have reviewed the patients History and Physical, chart, labs and discussed the procedure including the risks, benefits and alternatives for the proposed anesthesia with the patient or authorized representative who has indicated his/her understanding and acceptance.     Dental advisory given  Plan Discussed with: Anesthesiologist and CRNA  Anesthesia Plan Comments:         Anesthesia Quick Evaluation

## 2021-01-20 NOTE — Transfer of Care (Addendum)
Immediate Anesthesia Transfer of Care Note  Patient: Richard Lane  Procedure(s) Performed: XI ROBOTIC ASSISTED PARAESOPHAGEAL HERNIA REPAIR WITH MESH with  [name], PA- C assisting  Patient Location: PACU  Anesthesia Type:General  Level of Consciousness: awake  Airway & Oxygen Therapy: Patient Spontanous Breathing and Patient connected to face mask oxygen  Post-op Assessment: Report given to RN and Post -op Vital signs reviewed and stable  Post vital signs: Reviewed and stable  Last Vitals:  Vitals Value Taken Time  BP 165/107 01/20/21 1054  Temp    Pulse 90 01/20/21 1059  Resp 20 01/20/21 1059  SpO2 96 % 01/20/21 1059  Vitals shown include unvalidated device data.  Last Pain:  Vitals:   01/20/21 3710  TempSrc: Temporal  PainSc: 0-No pain         Complications: Continues to c/o not being able to breath. Stat chest xray ordered. Dr. Suzan Slick at bedside

## 2021-01-20 NOTE — Op Note (Signed)
Robotic assisted laparoscopic Nissen fundoplication w repair of paraesophageal  hernia with Bio-A mesh  Pre-operative Diagnosis: GERD, type III paraesophageal  hernia  Post-operative Diagnosis: same  Procedure:  Robotic assisted laparoscopic Nissen fundoplication w repair of  paraesophageal  hernia with Bio-A mesh  Surgeon: Sterling Big, MD FACS  Assistant: [name] PA-C. Required due to the complexity of the case the need for exposure and lack of first assist.  Anesthesia: Gen. with endotracheal tube  Findings: Moderate to large Type III paraesophageal hernia Loose wrap 360 degree over 50 FR Bougie   Estimated Blood Loss: 15cc       Specimens: Sac        Complications: none   Procedure Details  The patient was seen again in the Holding Room. The benefits, complications, treatment options, and expected outcomes were discussed with the patient. The risks of bleeding, infection, recurrence of symptoms, failure to resolve symptoms,  esophageal damage, Dysphagia, bowel injury, any of which could require further surgery were reviewed with the patient. The likelihood of improving the patient's symptoms with return to their baseline status is good.  The patient and/or family concurred with the proposed plan, giving informed consent.  The patient was taken to Operating Room, identified  and the procedure verified.  A Time Out was held and the above information confirmed.  Prior to the induction of general anesthesia, antibiotic prophylaxis was administered. VTE prophylaxis was in place. General endotracheal anesthesia was then administered and tolerated well. After the induction, the abdomen was prepped with Chloraprep and draped in the sterile fashion. The patient was positioned in the supine position.  Cut down technique was used to enter the abdominal cavity and a Hasson trochar was placed after two vicryl stitches were anchored to the fascia. Pneumoperitoneum was then created with CO2  and tolerated well without any adverse changes in the patient's vital signs.  Three 8-mm ports were placed under direct vision. All skin incisions  were infiltrated with a local anesthetic agent before making the incision and placing the trocars. An additional 5 mm regular laparoscopic port was placed to assist with retraction and exposure.   The patient was positioned  in reverse Trendelenburg, robot was brought to the surgical field and docked in the standard fashion.  We made sure all the instrumentation was kept indirect view at all times and that there were no collision between the arms. I scrubbed out and went to the console.  I used a robotic arm to retract the liver, the vessel sealer on my right hand and a forced bipolar grasper on my left hand.  There is along the extra 5 mm port allow me ample exposure and the ability to perform meticulous dissection  We Started dividing the lesser omentum via the pars flaccida.  We Were able to dissect the lesser curvature of the stomach and  dissected the fundus free from the right and left crus.  We circumferentially dissected the GE junction.  The hernia sac was also completely reduced and we were able to bring the stomach into the intra-abdominal position.  Attention then was turned to the greater curvature where the short gastrics were divided with sealer device.  We were able to identify the left crus and again were able to make sure there was a good circumferential dissection and that the hernia sac was completely excised.  We did perform a good  dissection within the mediastinum to allow a complete reduction of the sac and a to completely allow  an intra-abdominal Nissen fundoplication without any need of esophageal lengthening procedure. We excised the sac and sent it to pathology.  2-0V lock suture was inserted and the crus as well as the hernia was closed with a running suture , I used two strips of Bio-A as a pledged. Bio-A mesh was used to  reinforced the repair and was fixed using vistaseal  We Asked anesthesia to place a 50 French bougie and this went easily.  We also observe trajectory of the bougie. 360 degree Nissen fundoplication was created with multiple 2-0 silk sutures and we placed 3 stitches taking some of the esophagus within that bite.  The fundoplication measured approximately 3-1/2 cm and he was floppy. I was very happy with the way the fundoplication laid and the repair of the hernia.  Inspection of the  upper quadrant was performed. No bleeding, bile  Or esophageal injuries leaks, or bowel injuries were noted. Robotic instruments and robotic arms were undocked in the standard fashion. All the needles were removed under direct visualization.   I scrubbed back in.  Pneumoperitoneum was released.  The periumbilical port site was closed with interrumpted 0 Vicryl sutures. 4-0 subcuticular Monocryl was used to close the skin. Liposomal marcaine was injected to all the incisions sites.  Dermabond was  applied.  The patient was then extubated and brought to the recovery room in stable condition. Sponge, lap, and needle counts were correct at closure and at the conclusion of the case.               Sterling Big, MD, FACS

## 2021-01-20 NOTE — Interval H&P Note (Signed)
History and Physical Interval Note:  01/20/2021 7:19 AM  Richard Lane  has presented today for surgery, with the diagnosis of paraesophageal hernia.  The various methods of treatment have been discussed with the patient and family. After consideration of risks, benefits and other options for treatment, the patient has consented to  Procedure(s): XI ROBOTIC ASSISTED PARAESOPHAGEAL HERNIA REPAIR with  [name], PA- C assisting (N/A) as a surgical intervention.  The patient's history has been reviewed, patient examined, no change in status, stable for surgery.  I have reviewed the patient's chart and labs.  Questions were answered to the patient's satisfaction.     [name] F [name]

## 2021-01-20 NOTE — Anesthesia Procedure Notes (Signed)
Procedure Name: Intubation Date/Time: 01/20/2021 7:48 AM Performed by: Cheral Bay, CRNA Pre-anesthesia Checklist: Patient identified, Emergency Drugs available, Suction available and Patient being monitored Patient Re-evaluated:Patient Re-evaluated prior to induction Oxygen Delivery Method: Circle system utilized Preoxygenation: Pre-oxygenation with 100% oxygen Induction Type: IV induction Ventilation: Mask ventilation without difficulty Laryngoscope Size: McGraph and 4 Grade View: Grade III Tube type: Oral Number of attempts: 1 Airway Equipment and Method: Stylet, Oral airway and Video-laryngoscopy Placement Confirmation: ETT inserted through vocal cords under direct vision, positive ETCO2 and breath sounds checked- equal and bilateral Secured at: 22 cm Tube secured with: Tape Dental Injury: Teeth and Oropharynx as per pre-operative assessment  Comments: Anterior view

## 2021-01-20 NOTE — Anesthesia Postprocedure Evaluation (Signed)
Anesthesia Post Note  Patient: MARQUEZE RAMCHARAN  Procedure(s) Performed: XI ROBOTIC ASSISTED PARAESOPHAGEAL HERNIA REPAIR WITH MESH with  [name], PA- C assisting  Patient location during evaluation: PACU Anesthesia Type: General Level of consciousness: awake and alert Pain management: pain level controlled Vital Signs Assessment: post-procedure vital signs reviewed and stable Respiratory status: spontaneous breathing, nonlabored ventilation, respiratory function stable and patient connected to nasal cannula oxygen (15% R pneumothorax on CXR. patient hemodynamically stable, surgeon aware) Cardiovascular status: blood pressure returned to baseline and stable Postop Assessment: no apparent nausea or vomiting Anesthetic complications: no   No notable events documented.   Last Vitals:  Vitals:   01/20/21 1215 01/20/21 1220  BP: (!) 141/96   Pulse: 78 80  Resp: 17 14  Temp:    SpO2: 96% 96%    Last Pain:  Vitals:   01/20/21 1220  TempSrc:   PainSc: 5                  Corinda Gubler

## 2021-01-21 ENCOUNTER — Observation Stay: Payer: BC Managed Care – PPO

## 2021-01-21 ENCOUNTER — Encounter: Payer: Self-pay | Admitting: Surgery

## 2021-01-21 DIAGNOSIS — K449 Diaphragmatic hernia without obstruction or gangrene: Secondary | ICD-10-CM | POA: Diagnosis not present

## 2021-01-21 DIAGNOSIS — R0602 Shortness of breath: Secondary | ICD-10-CM | POA: Diagnosis not present

## 2021-01-21 DIAGNOSIS — I2510 Atherosclerotic heart disease of native coronary artery without angina pectoris: Secondary | ICD-10-CM | POA: Diagnosis not present

## 2021-01-21 DIAGNOSIS — Z87891 Personal history of nicotine dependence: Secondary | ICD-10-CM | POA: Diagnosis not present

## 2021-01-21 DIAGNOSIS — E119 Type 2 diabetes mellitus without complications: Secondary | ICD-10-CM | POA: Diagnosis not present

## 2021-01-21 DIAGNOSIS — K219 Gastro-esophageal reflux disease without esophagitis: Secondary | ICD-10-CM | POA: Diagnosis not present

## 2021-01-21 LAB — CBC
HCT: 36.3 % — ABNORMAL LOW (ref 39.0–52.0)
Hemoglobin: 12.9 g/dL — ABNORMAL LOW (ref 13.0–17.0)
MCH: 32.3 pg (ref 26.0–34.0)
MCHC: 35.5 g/dL (ref 30.0–36.0)
MCV: 90.8 fL (ref 80.0–100.0)
Platelets: 254 10*3/uL (ref 150–400)
RBC: 4 MIL/uL — ABNORMAL LOW (ref 4.22–5.81)
RDW: 12.1 % (ref 11.5–15.5)
WBC: 10.2 10*3/uL (ref 4.0–10.5)
nRBC: 0 % (ref 0.0–0.2)

## 2021-01-21 LAB — GLUCOSE, CAPILLARY: Glucose-Capillary: 158 mg/dL — ABNORMAL HIGH (ref 70–99)

## 2021-01-21 LAB — BASIC METABOLIC PANEL
Anion gap: 7 (ref 5–15)
BUN: 10 mg/dL (ref 6–20)
CO2: 24 mmol/L (ref 22–32)
Calcium: 8.8 mg/dL — ABNORMAL LOW (ref 8.9–10.3)
Chloride: 107 mmol/L (ref 98–111)
Creatinine, Ser: 0.63 mg/dL (ref 0.61–1.24)
GFR, Estimated: >60 mL/min (ref >60)
Glucose, Bld: 146 mg/dL — ABNORMAL HIGH (ref 70–99)
Potassium: 3.7 mmol/L (ref 3.5–5.1)
Sodium: 138 mmol/L (ref 135–145)

## 2021-01-21 LAB — HIV ANTIBODY (ROUTINE TESTING W REFLEX): HIV Screen 4th Generation wRfx: NONREACTIVE

## 2021-01-21 LAB — SURGICAL PATHOLOGY

## 2021-01-21 MED ORDER — IBUPROFEN 600 MG PO TABS: 600.0000 mg | ORAL_TABLET | Freq: Four times a day (QID) | ORAL | 0 refills | Status: AC | PRN

## 2021-01-21 MED ORDER — OXYCODONE HCL 5 MG PO TABS: 5.0000 mg | ORAL_TABLET | ORAL | 0 refills | Status: DC | PRN

## 2021-01-21 MED ORDER — ALPRAZOLAM 0.5 MG PO TABS: 0.5000 mg | ORAL_TABLET | Freq: Three times a day (TID) | ORAL | 0 refills | Status: DC | PRN

## 2021-01-21 NOTE — Discharge Summary (Signed)
[facility] SURGICAL DISCHARGE SUMMARY  Patient ID: Richard Lane MRN: [id number] DOB/AGE: 58-09-1960 58 y.o.  Admit date: 01/20/2021 Discharge date: 01/21/2021  Discharge Diagnoses Patient Active Problem List   Diagnosis Date Noted   S/P repair of paraesophageal hernia 01/20/2021   Right Pneumothorax     Consultants None  Procedures 01/20/2021:  Robotic assisted laparoscopic Nissen fundoplication w repair of  paraesophageal  hernia with Bio-A mesh  HPI: Richard Lane is a 60 y.o. male with a history of type III symptomatic paraesophageal hernia who presents to [facility] on 07/28 for scheduled paraesophageal hernia repair.   Hospital Course: Informed consent was obtained and documented, and patient underwent uneventful robotic assisted laparoscopic paraesophageal hernia repair with Nissen fundoplication (Dr Everlene Farrier, 01/20/2021).  Post-operatively, patient did have a small <15% right pneumothorax which was managed conservatively. On POD1, patient reports his breathing is much improved and had expected post-operative soreness.  Advancement of patient's diet and ambulation were well-tolerated. The remainder of patient's hospital course was essentially unremarkable, and discharge planning was initiated accordingly with patient safely able to be discharged home with appropriate discharge instructions, pain control, and outpatient follow-up after all of his questions were answered to his expressed satisfaction.   Discharge Condition: Good   Physical Examination:  Constitutional: Well appearing male, NAD Pulmonary: Normal effort, no respiratory distress, goog breath sounds Cardiac: HRRR, no m/r/g Gastrointestinal: Soft, incisional soreness, non-distended, no rebound.guarding Skin: Laparoscopic incisions are CDI with dermabond, no erythema or drainage    Allergies as of 01/21/2021   No Known Allergies      Medication List     TAKE these medications    ALPRAZolam 0.5 MG  tablet Commonly known as: XANAX Take 1 tablet (0.5 mg total) by mouth 3 (three) times daily as needed for anxiety.   atorvastatin 40 MG tablet Commonly known as: LIPITOR Take 40 mg by mouth at bedtime.   ibuprofen 600 MG tablet Commonly known as: ADVIL Take 1 tablet (600 mg total) by mouth every 6 (six) hours as needed.   oxyCODONE 5 MG immediate release tablet Commonly known as: Oxy IR/ROXICODONE Take 1 tablet (5 mg total) by mouth every 4 (four) hours as needed for severe pain or breakthrough pain.       ASK your doctor about these medications    metFORMIN 500 MG 24 hr tablet Commonly known as: GLUCOPHAGE-XR Take 4 tablets (2,000 mg total) by mouth daily with breakfast.   Ozempic (1 MG/DOSE) 4 MG/3ML Sopn Generic drug: Semaglutide (1 MG/DOSE) Inject 1 mg into the skin once a week.          Follow-up Information     Pabon, [place] F, MD. Schedule an appointment as soon as possible for a visit in 2 week(s).   Specialty: General Surgery Why: s/p paraesophageal hernia, Nissen...also with right PTX...needs CXR prior Contact information: [address] [place] [address] [phone]                  Time spent on discharge management including discussion of hospital course, clinical condition, outpatient instructions, prescriptions, and follow up with the patient and members of the medical team: >30 minutes  -- Lynden Oxford , PA-C [facility]  01/21/2021, 10:38 AM [phone] M-F: 7am - 4pm

## 2021-01-21 NOTE — Discharge Instructions (Signed)
In addition to included general post-operative instructions,  Diet: Recommend following Nissen diet recommendation x4 weeks (ex: no carbonation, uncooked breads, etc...). Handouts were provided.   Activity: No heavy lifting >20 pounds (children, pets, laundry, garbage) or strenuous activity for 4 weeks, but light activity and walking are encouraged. Do not drive or drink alcohol if taking narcotic pain medications or having pain that might distract from driving. Continue to use your incentive spirometer daily at home.   Wound care: 2 days after surgery (07/30), you may shower/get incision wet with soapy water and pat dry (do not rub incisions), but no baths or submerging incision underwater until follow-up.   Medications: Resume all home medications. For mild to moderate pain: acetaminophen (Tylenol) or ibuprofen/naproxen (if no kidney disease). Combining Tylenol with alcohol can substantially increase your risk of causing liver disease. Narcotic pain medications, if prescribed, can be used for severe pain, though may cause nausea, constipation, and drowsiness. Do not combine Tylenol and Percocet (or similar) within a 6 hour period as Percocet (and similar) contain(s) Tylenol. If you do not need the narcotic pain medication, you do not need to fill the prescription.  Call office [phone] / [phone]) at any time if any questions, worsening pain, fevers/chills, bleeding, drainage from incision site, shortness of breath, severe chest pain, or other concerns.

## 2021-01-22 ENCOUNTER — Telehealth: Payer: Self-pay | Admitting: Surgery

## 2021-01-22 LAB — HEMOGLOBIN A1C
Hgb A1c MFr Bld: 8.1 % — ABNORMAL HIGH (ref 4.8–5.6)
Mean Plasma Glucose: 186 mg/dL

## 2021-01-22 NOTE — Telephone Encounter (Signed)
Returned call to Richard Lane for c/o uncontrolled interscapular pain.   We reviewed that his last Xanax taken was last night, and he had taken ibuprofen as needed with his pain yesterday. He reports taking 2 oxycodone this morning an hour ago, and did not have the anticipated relief of the scapular pain, more right than left.  He denies shortness of breath and is able to speak fairly clearly without any evidence of distress.  He has been able to swallow without difficulty and denies nausea and abdominal pain. Reviewed utilizing the ibuprofen on schedule meals.  Supplementing his pain relief with Xanax on a as needed basis as scheduled, to prioritize this over his oxycodone.  He can take 2 oxycodone every 4 hours as needed.  He was concerned he would likely run out tomorrow on the schedule, I assured him that he may not need it as frequently, as he takes the ibuprofen and Xanax. He expressed that this could provide improvement in his pain, and will take this approach.  He may contact me tomorrow should he need additional narcotic pain medication.

## 2021-01-25 ENCOUNTER — Telehealth: Payer: Self-pay | Admitting: *Deleted

## 2021-01-25 ENCOUNTER — Other Ambulatory Visit: Payer: Self-pay | Admitting: Physician Assistant

## 2021-01-25 MED ORDER — ALPRAZOLAM 0.5 MG PO TABS: 0.5000 mg | ORAL_TABLET | Freq: Three times a day (TID) | ORAL | 0 refills | Status: AC | PRN

## 2021-01-25 MED ORDER — OXYCODONE HCL 10 MG PO TABS: 10.0000 mg | ORAL_TABLET | ORAL | 0 refills | Status: AC | PRN

## 2021-01-25 NOTE — Telephone Encounter (Signed)
Patient called and wanted to see if he can another refill on 10mg  oxycodone and xanax. Please call spouse first- Marlowe Lawes [phone]  Patient had surgery on 01/20/21 Dr 01/22/21 Paraesophageal hernia repair

## 2021-01-25 NOTE — Telephone Encounter (Signed)
Medications sent per Zach-I spoke with patient to let him know to be sure and alternate ibuprofen as well-reminded patient of his post operative appointment next week.

## 2021-01-25 NOTE — Progress Notes (Signed)
Reviewed patient telephone message regarding refill request for Oxycodone and Xanax. Does seem he is also trying to supplement ibuprofen but had been requiring 10 mg of Oxycodone at home every 4-6 hours.   I will go ahead and switch him to 10 mg Oxycodone q6 PRN; new Rx sent I will also provide one additional refill for his Xanax I will reinforce using alternative measures of pain control with tylenol/ibuprofen/ice Will be very hesitant to provide additional refills until his scheduled post-operative follow up appointment  -- Lynden Oxford, PA-C [facility] 01/25/2021, 10:32 AM [phone] M-F: 7am - 4pm

## 2021-02-02 ENCOUNTER — Other Ambulatory Visit: Payer: Self-pay

## 2021-02-02 ENCOUNTER — Encounter: Payer: Self-pay | Admitting: Surgery

## 2021-02-02 ENCOUNTER — Ambulatory Visit (INDEPENDENT_AMBULATORY_CARE_PROVIDER_SITE_OTHER): Payer: BC Managed Care – PPO | Admitting: Surgery

## 2021-02-02 VITALS — BP 122/77 | HR 85 | Temp 97.8°F | Ht 70.0 in | Wt 168.6 lb

## 2021-02-02 DIAGNOSIS — Z09 Encounter for follow-up examination after completed treatment for conditions other than malignant neoplasm: Secondary | ICD-10-CM

## 2021-02-02 NOTE — Patient Instructions (Addendum)
Take 1 Metformin in the morning and 1 at night to see if it will relieve the nausea. We will try that for a week.  If you have any concerns or questions, please feel free to call our office. See follow up appointment below.   Eating Plan After Nissen Fundoplication After a Nissen fundoplication procedure, it is common to have some difficulty swallowing. The part of your body that moves food and liquid from your mouth to your stomach (esophagus) will be swollen and may feel tight. It will take several weeks or months foryour esophagus and stomach to heal. By following a special eating plan, you can prevent problems such as pain, swelling or pressure in the abdomen (bloating), gas, nausea, or diarrhea. What are tips for following this plan? Cooking Cook all foods until they are soft. Remove skins and seeds from fruits and vegetables before eating. Remove skin and gristle from meats. Grind or finely mince meats before eating. Avoid over-cooking meat. Dry, tough meat is more difficult to swallow. Avoid using oil when cooking, or use only a small amount of oil. Avoid using seasoning when cooking, or use only a small amount of seasoning. Toast bread before eating. This makes it easier to swallow. Meal planning  Eat 6-8 small meals throughout the day. Right after the surgery, have a few meals that are only clear liquids. Clear liquids include: Water. Clear fruit juice, no pulp. Chicken, beef, or vegetable broth. Gelatin. Decaffeinated tea or coffee without milk. Popsicles or shaved ice. Depending on your progress, you may move to a full liquid diet as told by your health care provider. This includes clear liquids and the following: Dairy and alternative milks, such as soy milk. Strained creamed soups. Ice cream or sherbet. Pudding. Nutritional supplement drinks. Yogurt. A few days after surgery, you may be able to start eating a diet of soft foods. You may need to eat according to this plan  for several weeks. Do not eat sweets or sweetened drinks at the beginning of a meal. Doing that may cause your stomach to empty faster than it should (dumping syndrome).  Lifestyle Always sit upright when eating or drinking. Eat slowly. Take small bites and chew food well before swallowing. Do not lie down after eating. Stay sitting up for 30 minutes or longer after each meal. Sip fluids between meals. Limit how much you drink at one time. With meals and snacks, have 4-8 oz (120-240 mL). This is equal to  cup-1 cup. Do not mix solid foods and liquids in the same mouthful. Drink enough fluid to keep your urine pale yellow. Do not chew gum or drink fluids through a straw. Doing those things may cause you to swallow extra air. General information Do not drink carbonated drinks or alcohol. Avoid foods and drinks that contain caffeine and chocolate. Avoid foods and drinks that contain citrus or tomato. Allow hot soups and drinks to cool before eating. Avoid foods that cause gas, such as beans, peas, broccoli, or cabbage. If dairy milk products cause diarrhea, avoid them or eat them in small amounts. Recommended foods Fruits Any soft-cooked fruits after skins and seeds are removed. Fruit juice. Vegetables Any soft-cooked vegetables after skins and seeds are removed. Vegetable juice. Grains Cooked cereals. Dry cereals softened with liquid. Cooked pasta, rice, or other grains. Toasted bread. Bland crackers, such as soda or graham crackers. Meats and other protein foods Tender cuts of meat, poultry, or fish after bones, skin, and gristle are removed. Poached, boiled,  or scrambled eggs. Canned fish. Tofu. Creamy nut butters. Dairy Milk. Yogurt. Cottage cheese. Mild cheeses. Beverages Nutritional supplement drinks. Decaffeinated tea or coffee. Sports drinks. Fats and oils Butter. Margarine. Mayonnaise. Vegetable oil. Smooth salad dressing. Sweets and desserts Plain hard candy. Marshmallows.  Pudding. Ice cream. Gelatin. Sherbet. Seasoning and other foods Salt. Light seasonings. Mustard. Vinegar. The items listed above may not be a complete list of recommended foods and beverages. Contact a dietitian for more information. Foods to avoid Fruits Oranges. Grapefruit. Lemons. Limes. Citrus juices. Dried fruit. Crunchy, rawfruits. Vegetables Tomato sauce. Tomato juice. Broccoli. Cauliflower. Cabbage. Brussels sprouts.Crunchy, raw vegetables. Grains High-fiber or bran cereal. Cereal with nuts, dried fruit, or coconut. Sweet breads, rolls, coffee cake, or donuts. Chewy or crusty breads. Popcorn. Meats and other protein foods Beans, peas, and lentils. Tough or fatty meats. Fried meats, chicken, or fish. Fried eggs. Nuts and seeds. Crunchy nut butters. Dairy Chocolate milk. Yogurt with chunks of fruit, nuts, seeds, or coconut. Strong cheeses. Beverages Carbonated soft drinks. Alcohol. Cocoa. Hot drinks. Fats and oils Bacon fat. Lard. Sweets and desserts Chocolate. Candy with nuts, coconut, or seeds. Peppermint. Cookies. Cakes. Pie crust. Seasoning and other foods Heavy seasonings. Chili sauce. Ketchup. Barbecue sauce. Rosita Fire. Horseradish. The items listed above may not be a complete list of foods and beverages to avoid. Contact a dietitian for more information. Summary Following this eating plan after a Nissen fundoplication is an important part of healing after surgery. After surgery, you will start with a clear liquid diet before you progress to full liquids and soft foods. You may need to eat soft foods for several weeks. Avoid eating foods that cause irritation, gas, nausea, diarrhea, or swelling or pressure in the abdomen (bloating), and avoid foods that are difficult to swallow. Talk with a dietitian about which dietary choices are best for you. This information is not intended to replace advice given to you by your health care provider. Make sure you discuss any questions you  have with your healthcare provider. Document Revised: 12/28/2019 Document Reviewed: 12/28/2019 Elsevier Patient Education  2022 [employer].

## 2021-02-04 NOTE — Progress Notes (Signed)
Kaulin is approximately 2 weeks out after robotic paraesophageal hernia repair.  He is doing well but does have some nausea.  He attributes this to the metformin that has been increased recently.  No fevers no chills.  No dysphagia.  He is having bowel movements  PE NAD  Chesty CTA, nsr, s1,s2 Abd: soft, nt , patient is healing well without evidence of infection or peritonitis  A/p Doing well RTC in a few weeks , no evidence of complications May f/u endocrinologist for DM

## 2021-02-09 ENCOUNTER — Telehealth: Payer: Self-pay | Admitting: *Deleted

## 2021-02-09 NOTE — Telephone Encounter (Signed)
Patient is asking to eat bread.Per Dr.Pabon said next week would be ok to eat bread. Keep your follow up appointment next week.

## 2021-02-09 NOTE — Telephone Encounter (Signed)
Patient called and wanted to know if he can start eating bread and cereal. He has been tolerating eating meats this last week with no issues.   He had hiatal hernia surgery by Dr Everlene Farrier on 01/20/21

## 2021-02-09 NOTE — Telephone Encounter (Signed)
Inbound call from the pt confirming the voicemail left by [facility], CMA on 02/09/21.  He called back to verify if he would be able to eat pinto beans &/or a burrito, post hiatal hernia repair w/Dr. Everlene Farrier (01/20/21).  After speaking directly w/Dr. Everlene Farrier, he has instructed that the pt should try to eat a 1/4 of a burrito first, chewing each bite until the consistency is "mush" & drink lots of water throughout the meal.  In fact, he should drink a lot of water in general. The pt acknowledged understanding & thanked.

## 2021-02-17 ENCOUNTER — Ambulatory Visit: Payer: BC Managed Care – PPO | Admitting: Endocrinology

## 2021-02-21 ENCOUNTER — Encounter: Payer: Self-pay | Admitting: Surgery

## 2021-02-21 ENCOUNTER — Ambulatory Visit (INDEPENDENT_AMBULATORY_CARE_PROVIDER_SITE_OTHER): Payer: BC Managed Care – PPO | Admitting: Surgery

## 2021-02-21 ENCOUNTER — Other Ambulatory Visit: Payer: Self-pay

## 2021-02-21 VITALS — BP 122/84 | HR 78 | Temp 98.1°F | Ht 70.0 in | Wt 168.6 lb

## 2021-02-21 DIAGNOSIS — K449 Diaphragmatic hernia without obstruction or gangrene: Secondary | ICD-10-CM

## 2021-02-21 MED ORDER — ONDANSETRON HCL 4 MG PO TABS: 4.0000 mg | ORAL_TABLET | Freq: Three times a day (TID) | ORAL | 0 refills | Status: AC | PRN

## 2021-02-21 MED ORDER — METOCLOPRAMIDE HCL 5 MG PO TABS: 5.0000 mg | ORAL_TABLET | Freq: Three times a day (TID) | ORAL | 1 refills | Status: AC

## 2021-02-21 NOTE — Patient Instructions (Addendum)
Your CT is scheduled for Monday March 07, 2021 @ 2 pm (arrive at 1:30 pm) [facility]. Nothing to eat or drink 4 hours prior. Pick up contrast at the radiology department between now and September 9th.  Please pick up your prescriptions at your pharmacy.   If you have any concerns or questions, please feel free to call our office. See follow up appointment below.    GENERAL POST-OPERATIVE PATIENT INSTRUCTIONS   WOUND CARE INSTRUCTIONS:  Keep a dry clean dressing on the wound if there is drainage. The initial bandage may be removed after 24 hours.  Once the wound has quit draining you may leave it open to air.  If clothing rubs against the wound or causes irritation and the wound is not draining you may cover it with a dry dressing during the daytime.  Try to keep the wound dry and avoid ointments on the wound unless directed to do so.  If the wound becomes bright red and painful or starts to drain infected material that is not clear, please contact your physician immediately.  If the wound is mildly pink and has a thick firm ridge underneath it, this is normal, and is referred to as a healing ridge.  This will resolve over the next 4-6 weeks.  BATHING: You may shower if you have been informed of this by your surgeon. However, Please do not submerge in a tub, hot tub, or pool until incisions are completely sealed or have been told by your surgeon that you may do so.  DIET:  You may eat any foods that you can tolerate.  It is a good idea to eat a high fiber diet and take in plenty of fluids to prevent constipation.  If you do become constipated you may want to take a mild laxative or take ducolax tablets on a daily basis until your bowel habits are regular.  Constipation can be very uncomfortable, along with straining, after recent surgery.  ACTIVITY:  You are encouraged to cough and deep breath or use your incentive spirometer if you were given one, every 15-30 minutes when awake.  This will help  prevent respiratory complications and low grade fevers post-operatively if you had a general anesthetic.  You may want to hug a pillow when coughing and sneezing to add additional support to the surgical area, if you had abdominal or chest surgery, which will decrease pain during these times.  You are encouraged to walk and engage in light activity for the next two weeks.  You should not lift more than 15 pounds, until 03/03/2021 as it could put you at increased risk for complications.  Twenty pounds is roughly equivalent to a plastic bag of groceries. At that time- Listen to your body when lifting, if you have pain when lifting, stop and then try again in a few days. Soreness after doing exercises or activities of daily living is normal as you get back in to your normal routine.  MEDICATIONS:  Try to take narcotic medications and anti-inflammatory medications, such as tylenol, ibuprofen, naprosyn, etc., with food.  This will minimize stomach upset from the medication.  Should you develop nausea and vomiting from the pain medication, or develop a rash, please discontinue the medication and contact your physician.  You should not drive, make important decisions, or operate machinery when taking narcotic pain medication.  SUNBLOCK Use sun block to incision area over the next year if this area will be exposed to sun. This helps decrease scarring and  will allow you avoid a permanent darkened area over your incision.  Gastroparesis  Gastroparesis is a condition in which food takes longer than normal to empty from the stomach. This condition is also known as delayed gastric emptying. It is usually a long-term (chronic) condition. There is no cure, but there are treatments and things that you can do at home to help relieve symptoms. Treating the underlying condition that causesgastroparesis can also help relieve symptoms. What are the causes? In many cases, the cause of this condition is not known. Possible  causes include: A hormone (endocrine) disorder, such as hypothyroidism or diabetes. A nervous system disease, such as Parkinson's disease or multiple sclerosis. Cancer, infection, or surgery that affects the stomach or vagus nerve. The vagus nerve runs from your chest, through your neck, and to the lower part of your brain. A connective tissue disorder, such as scleroderma. Certain medicines. What increases the risk? You are more likely to develop this condition if: You have certain disorders or diseases. These may include: An endocrine disorder. An eating disorder. Amyloidosis. Scleroderma. Parkinson's disease. Multiple sclerosis. Cancer or infection of the stomach or the vagus nerve. You have had surgery on your stomach or vagus nerve. You take certain medicines. You are male. What are the signs or symptoms? Symptoms of this condition include: Feeling full after eating very little or a loss of appetite. Nausea, vomiting, or heartburn. Bloating of your abdomen. Inconsistent blood sugar (glucose) levels on blood tests. Unexplained weight loss. Acid from the stomach coming up into the esophagus (gastroesophageal reflux). Sudden tightening (spasm) of the stomach, which can be painful. Symptoms may come and go. Some people may not notice any symptoms. How is this diagnosed? This condition is diagnosed with tests, such as: Tests that check how long it takes food to move through the stomach and intestines. These tests include: Upper gastrointestinal (GI) series. For this test, you drink a liquid that shows up well on X-rays, and then X-rays are taken of your intestines. Gastric emptying scintigraphy. For this test, you eat food that contains a small amount of radioactive material, and then scans are taken. Wireless capsule GI monitoring system. For this test, you swallow a pill (capsule) that records information about how foods and fluid move through your stomach. Gastric manometry.  For this test, a tube is passed down your throat and into your stomach to measure electrical and muscular activity. Endoscopy. For this test, a long, thin tube with a camera and light on the end is passed down your throat and into your stomach to check for problems in your stomach lining. Ultrasound. This test uses sound waves to create images of the inside of your body. This can help rule out gallbladder disease or pancreatitis as a cause of your symptoms. How is this treated? There is no cure for this condition, but treatment and home care may relieve symptoms. Treatment may include: Treating the underlying cause. Managing your symptoms by making changes to your diet and exercise habits. Taking medicines to control nausea and vomiting and to stimulate stomach muscles. Getting food through a feeding tube in the hospital. This may be done in severe cases. Having surgery to insert a device called a gastric electrical stimulator into your body. This device helps improve stomach emptying and control nausea and vomiting. Follow these instructions at home: Take over-the-counter and prescription medicines only as told by your health care provider. Follow instructions from your health care provider about eating or drinking restrictions. Your health  care provider may recommend that you: Eat smaller meals more often. Eat low-fat foods. Eat low-fiber forms of high-fiber foods. For example, eat cooked vegetables instead of raw vegetables. Have only liquid foods instead of solid foods. Liquid foods are easier to digest. Drink enough fluid to keep your urine pale yellow. Exercise as often as told by your health care provider. Keep all follow-up visits. This is important. Contact a health care provider if you: Notice that your symptoms do not improve with treatment. Have new symptoms. Get help right away if you: Have severe pain in your abdomen that does not improve with treatment. Have nausea that is  severe or does not go away. Vomit every time you drink fluids. Summary Gastroparesis is a long-term (chronic) condition in which food takes longer than normal to empty from the stomach. Symptoms include nausea, vomiting, heartburn, bloating of your abdomen, and loss of appetite. Eating smaller portions, low-fat foods, and low-fiber forms of high-fiber foods may help you manage your symptoms. Get help right away if you have severe pain in your abdomen. This information is not intended to replace advice given to you by your health care provider. Make sure you discuss any questions you have with your healthcare provider. Document Revised: 10/20/2019 Document Reviewed: 10/20/2019 Elsevier Patient Education  2022 [employer].

## 2021-02-22 ENCOUNTER — Encounter: Payer: Self-pay | Admitting: Surgery

## 2021-02-22 NOTE — Progress Notes (Signed)
Richard Lane s/p robotic Nissen and HH repair. Did well initially for the first few weeks and now has intermittent abdominal pain and bloating.  Also experiences nausea.  No fevers or chills,  no dysphagia, + PO and + Bms  Pain seems to worsened after sugary drinks  PE NAD Abd: soft, minimal incisional tenderness on periumbilical site, no hernia, infection or hematomas No peritonitis  A/P Peristent Nausea, ? Gastroparesis vs bloating after Nissen Zofran prn and Reglan I will order CT scan to make sure we are not missing anything Advice about small portions snf avoidance of sugary drinks RTC after w/u is completed

## 2021-03-07 ENCOUNTER — Encounter: Payer: BC Managed Care – PPO | Admitting: Surgery

## 2021-03-07 ENCOUNTER — Ambulatory Visit: Admission: RE | Admit: 2021-03-07 | Payer: BC Managed Care – PPO | Source: Ambulatory Visit

## 2021-04-12 ENCOUNTER — Other Ambulatory Visit: Payer: Self-pay

## 2021-04-12 ENCOUNTER — Telehealth: Payer: Self-pay | Admitting: Surgery

## 2021-04-12 DIAGNOSIS — K449 Diaphragmatic hernia without obstruction or gangrene: Secondary | ICD-10-CM

## 2021-04-12 DIAGNOSIS — Z09 Encounter for follow-up examination after completed treatment for conditions other than malignant neoplasm: Secondary | ICD-10-CM

## 2021-04-12 NOTE — Telephone Encounter (Signed)
Inbound call from Rosalita Chessman, pt's spouse, stating concern that the pt "just hasn't been right since surgery" w/Dr. Everlene Farrier on 01/20/21.  The pt had a paraesophageal hernia repair almost 3 months ago & the pt's quality of life is so low since he is constantly nauseated (practically 24/7); even water makes him nauseous some times.  She is aware that Dr. Everlene Farrier indicated [facility] would need a follow up CT to be able to detect if there was an internal issue & one was scheduled for 9/12 w/a post op appt also scheduled that day; however both appt's were no-showed by the pt.  Per Rosalita Chessman, they cannot afford to pay the $5,000 that another CT would cost, especially since they are still trying to maintain the bills from surgery 3 months prior.  Her "ask" is that another CT be scheduled & we push to have insurance cover since it is of such great importance to figure out what is going on w/the pt.  Rosalita Chessman was educated that although the provider can order studies, that does not mean that the insurance company is forced to cover or that there will be no cost to the pt.  At this point, she is willing to try whatever can be done, including imaging or general advise on how to get the pt feeling better again.  She is also aware that the msg will be routed through the clinical team for Dr. Everlene Farrier to review upon his return tomorrow (04/13/21).  Thank you

## 2021-04-14 ENCOUNTER — Telehealth: Payer: Self-pay | Admitting: Surgery

## 2021-04-14 NOTE — Telephone Encounter (Signed)
Patient is calling and is needing a refill on his medication has no more refills. Please call patient and advise.

## 2021-04-14 NOTE — Telephone Encounter (Signed)
Per Ulla Gallo we are waiting on the patient's wife to call back with the name of the medication he wants refilled as she was not sure at the time of her call.

## 2021-04-15 NOTE — Telephone Encounter (Signed)
Faxed received from patient's pharmacy for a refill of Zofran. Request has been authorized by Dr Everlene Farrier. Refill faxed back to the pharmacy.

## 2021-04-28 ENCOUNTER — Ambulatory Visit: Admission: RE | Admit: 2021-04-28 | Payer: BC Managed Care – PPO | Source: Ambulatory Visit

## 2021-04-28 ENCOUNTER — Other Ambulatory Visit: Payer: Self-pay

## 2021-04-28 ENCOUNTER — Ambulatory Visit
Admission: RE | Admit: 2021-04-28 | Discharge: 2021-04-28 | Disposition: A | Discharge status: Home / Self Care | Payer: BC Managed Care – PPO | Source: Ambulatory Visit | Attending: Surgery | Admitting: Surgery

## 2021-04-28 DIAGNOSIS — S22080A Wedge compression fracture of T11-T12 vertebra, initial encounter for closed fracture: Secondary | ICD-10-CM | POA: Diagnosis not present

## 2021-04-28 DIAGNOSIS — K449 Diaphragmatic hernia without obstruction or gangrene: Secondary | ICD-10-CM | POA: Diagnosis not present

## 2021-04-28 DIAGNOSIS — R110 Nausea: Secondary | ICD-10-CM | POA: Diagnosis not present

## 2021-04-28 DIAGNOSIS — K5730 Diverticulosis of large intestine without perforation or abscess without bleeding: Secondary | ICD-10-CM | POA: Diagnosis not present

## 2021-04-28 LAB — POCT I-STAT CREATININE: Creatinine, Ser: 0.6 mg/dL — ABNORMAL LOW (ref 0.61–1.24)

## 2021-04-28 MED ORDER — IOHEXOL 300 MG/ML  SOLN
100.0000 mL | Freq: Once | INTRAMUSCULAR | Status: AC | PRN
  Administered 2021-04-28: 100 mL via INTRAVENOUS

## 2021-05-31 DIAGNOSIS — Z23 Encounter for immunization: Secondary | ICD-10-CM | POA: Diagnosis not present

## 2021-05-31 DIAGNOSIS — E785 Hyperlipidemia, unspecified: Secondary | ICD-10-CM | POA: Diagnosis not present

## 2021-05-31 DIAGNOSIS — E291 Testicular hypofunction: Secondary | ICD-10-CM | POA: Diagnosis not present

## 2021-05-31 DIAGNOSIS — E781 Pure hyperglyceridemia: Secondary | ICD-10-CM | POA: Diagnosis not present

## 2021-05-31 DIAGNOSIS — E1169 Type 2 diabetes mellitus with other specified complication: Secondary | ICD-10-CM | POA: Diagnosis not present

## 2021-06-01 DIAGNOSIS — E781 Pure hyperglyceridemia: Secondary | ICD-10-CM | POA: Diagnosis not present

## 2021-06-01 DIAGNOSIS — E291 Testicular hypofunction: Secondary | ICD-10-CM | POA: Diagnosis not present

## 2021-06-01 DIAGNOSIS — M1A09X0 Idiopathic chronic gout, multiple sites, without tophus (tophi): Secondary | ICD-10-CM | POA: Diagnosis not present

## 2021-06-01 DIAGNOSIS — E1169 Type 2 diabetes mellitus with other specified complication: Secondary | ICD-10-CM | POA: Diagnosis not present

## 2021-06-01 DIAGNOSIS — Z125 Encounter for screening for malignant neoplasm of prostate: Secondary | ICD-10-CM | POA: Diagnosis not present

## 2021-06-01 DIAGNOSIS — E785 Hyperlipidemia, unspecified: Secondary | ICD-10-CM | POA: Diagnosis not present

## 2021-08-02 DIAGNOSIS — R110 Nausea: Secondary | ICD-10-CM | POA: Diagnosis not present

## 2021-08-02 DIAGNOSIS — Z800 Family history of malignant neoplasm of digestive organs: Secondary | ICD-10-CM | POA: Diagnosis not present

## 2021-08-02 DIAGNOSIS — R634 Abnormal weight loss: Secondary | ICD-10-CM | POA: Diagnosis not present

## 2021-09-15 DIAGNOSIS — E1165 Type 2 diabetes mellitus with hyperglycemia: Secondary | ICD-10-CM | POA: Diagnosis not present

## 2021-09-15 DIAGNOSIS — E119 Type 2 diabetes mellitus without complications: Secondary | ICD-10-CM | POA: Diagnosis not present

## 2021-10-03 DIAGNOSIS — Z7984 Long term (current) use of oral hypoglycemic drugs: Secondary | ICD-10-CM | POA: Diagnosis not present

## 2021-10-03 DIAGNOSIS — Z794 Long term (current) use of insulin: Secondary | ICD-10-CM | POA: Diagnosis not present

## 2021-10-03 DIAGNOSIS — Z5181 Encounter for therapeutic drug level monitoring: Secondary | ICD-10-CM | POA: Diagnosis not present

## 2021-10-03 DIAGNOSIS — E1165 Type 2 diabetes mellitus with hyperglycemia: Secondary | ICD-10-CM | POA: Diagnosis not present

## 2021-10-12 DIAGNOSIS — Z794 Long term (current) use of insulin: Secondary | ICD-10-CM | POA: Diagnosis not present

## 2021-10-12 DIAGNOSIS — E119 Type 2 diabetes mellitus without complications: Secondary | ICD-10-CM | POA: Diagnosis not present

## 2021-10-31 DIAGNOSIS — Z5181 Encounter for therapeutic drug level monitoring: Secondary | ICD-10-CM | POA: Diagnosis not present

## 2021-10-31 DIAGNOSIS — Z7984 Long term (current) use of oral hypoglycemic drugs: Secondary | ICD-10-CM | POA: Diagnosis not present

## 2021-10-31 DIAGNOSIS — E1165 Type 2 diabetes mellitus with hyperglycemia: Secondary | ICD-10-CM | POA: Diagnosis not present

## 2021-10-31 DIAGNOSIS — Z794 Long term (current) use of insulin: Secondary | ICD-10-CM | POA: Diagnosis not present

## 2021-12-12 DIAGNOSIS — E119 Type 2 diabetes mellitus without complications: Secondary | ICD-10-CM | POA: Diagnosis not present

## 2021-12-31 DIAGNOSIS — S60562A Insect bite (nonvenomous) of left hand, initial encounter: Secondary | ICD-10-CM | POA: Diagnosis not present

## 2022-02-02 DIAGNOSIS — E11649 Type 2 diabetes mellitus with hypoglycemia without coma: Secondary | ICD-10-CM | POA: Diagnosis not present

## 2022-02-02 DIAGNOSIS — E119 Type 2 diabetes mellitus without complications: Secondary | ICD-10-CM | POA: Diagnosis not present

## 2022-02-02 DIAGNOSIS — Z713 Dietary counseling and surveillance: Secondary | ICD-10-CM | POA: Diagnosis not present

## 2022-02-02 DIAGNOSIS — E785 Hyperlipidemia, unspecified: Secondary | ICD-10-CM | POA: Diagnosis not present

## 2022-03-24 DIAGNOSIS — Z125 Encounter for screening for malignant neoplasm of prostate: Secondary | ICD-10-CM | POA: Diagnosis not present

## 2022-03-24 DIAGNOSIS — E119 Type 2 diabetes mellitus without complications: Secondary | ICD-10-CM | POA: Diagnosis not present

## 2022-03-24 DIAGNOSIS — Z794 Long term (current) use of insulin: Secondary | ICD-10-CM | POA: Diagnosis not present

## 2022-03-24 DIAGNOSIS — E782 Mixed hyperlipidemia: Secondary | ICD-10-CM | POA: Diagnosis not present

## 2022-06-04 IMAGING — RF DG ESOPHAGUS
14 of 18 series · 14 of 24 positions shown · non-contrast
Comparison: CT of the chest dated July 02, 2020

CLINICAL DATA: Hiatal hernia.

EXAM:
ESOPHOGRAM / BARIUM SWALLOW / BARIUM TABLET STUDY
TECHNIQUE: Combined double contrast and single contrast examination performed
using effervescent crystals, thick barium liquid, and thin barium
liquid. The patient was observed with fluoroscopy swallowing a 13 mm
barium sulphate tablet.
FLUOROSCOPY TIME:  Fluoroscopy Time:  2 minutes 42 seconds
Radiation Exposure Index (if provided by the fluoroscopic device):
30.5 mGy
Number of Acquired Spot Images: 3

[Series 1: cp_standard · 0.17mm/px · 1 of 23 frames shown (1 of 14)]
[frame 4/23]
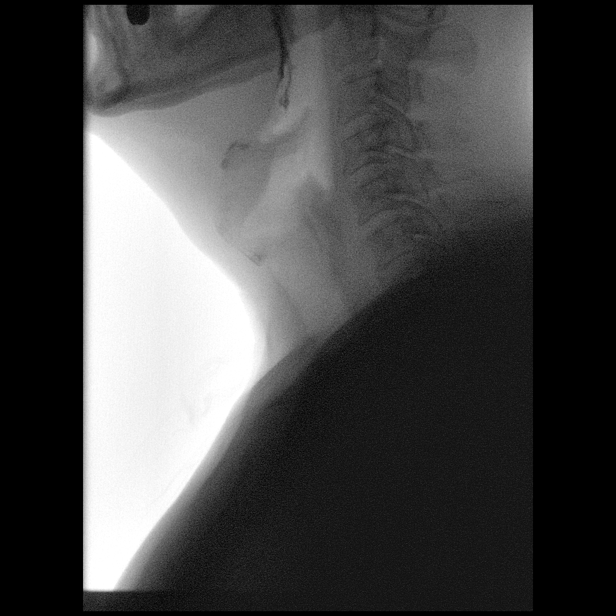

[Series 2: cp_standard · 0.17mm/px · 1 of 20 frames shown (2 of 14)]
[frame 11/20]
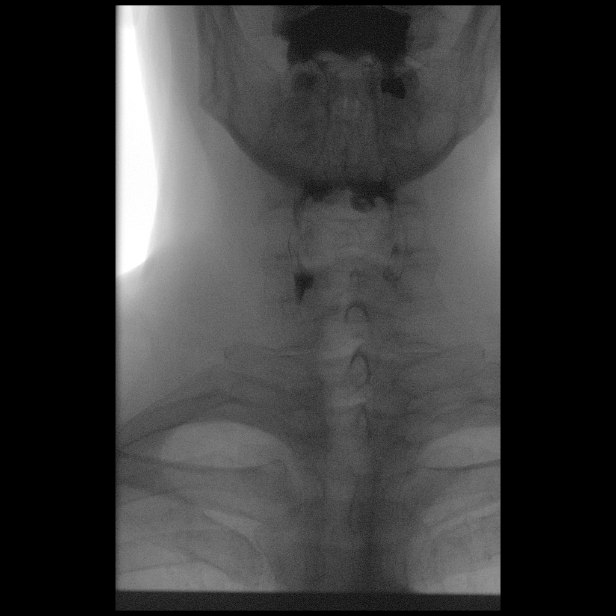

[Series 3: cp_standard · 0.18mm/px · 1 of 35 frames shown (3 of 14)]
[frame 30/35]
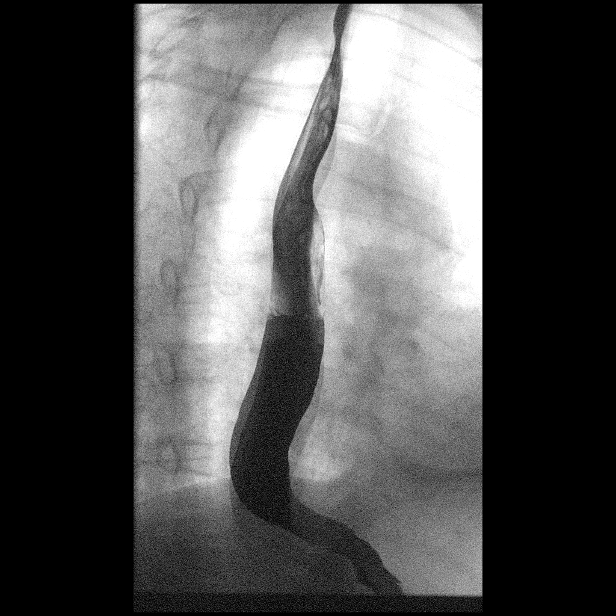

[Series 6: cp_standard · 0.18mm/px · 1 of 8 frames shown (4 of 14)]
[frame 7/8]
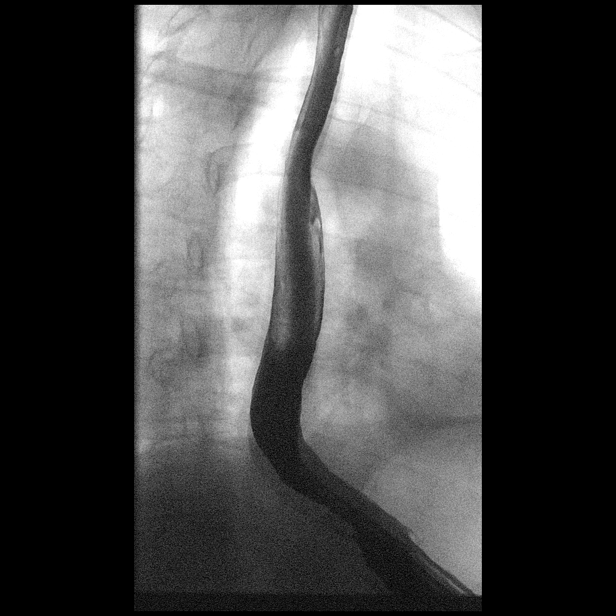

[Series 7: cp_standard · 0.18mm/px · 1 of 24 frames shown (5 of 14)]
[frame 21/24]
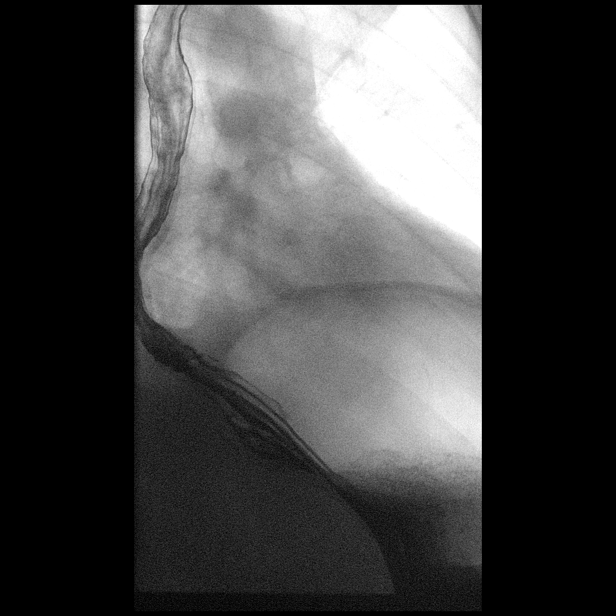

[Series 9: cp_standard · 0.18mm/px · 1 of 27 frames shown (6 of 14)]
[frame 14/27]
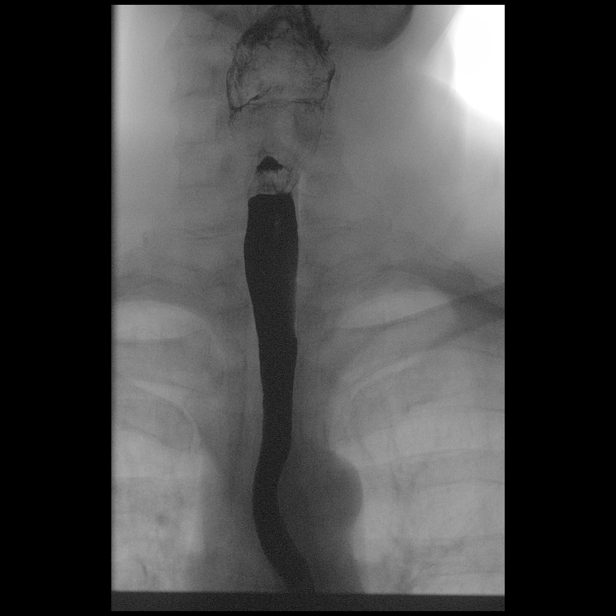

[Series 10: cp_standard · 0.18mm/px · 1 of 10 frames shown (7 of 14)]
[frame 9/10]
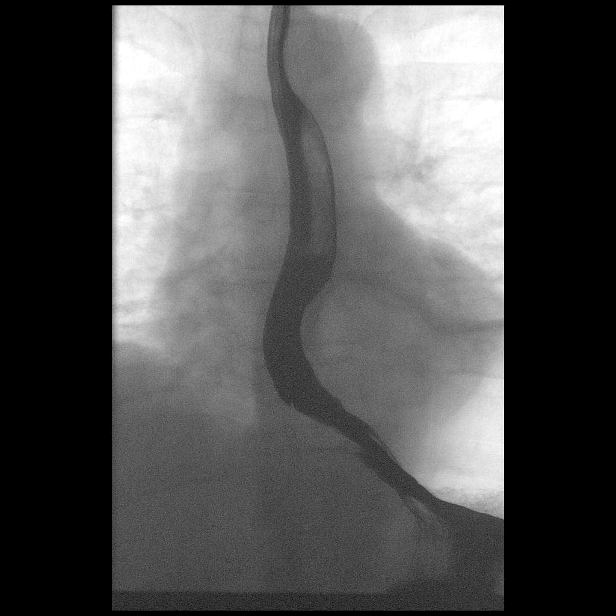

[Series 12: cp_standard · 0.19mm/px · 1 of 15 frames shown (8 of 14)]
[frame 3/15]
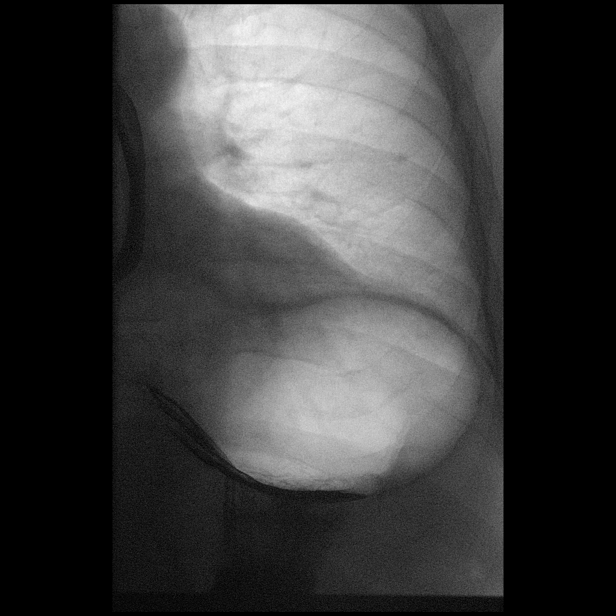

[Series 13: cp_standard · 0.18mm/px · 1 of 36 frames shown (9 of 14)]
[frame 24/36]
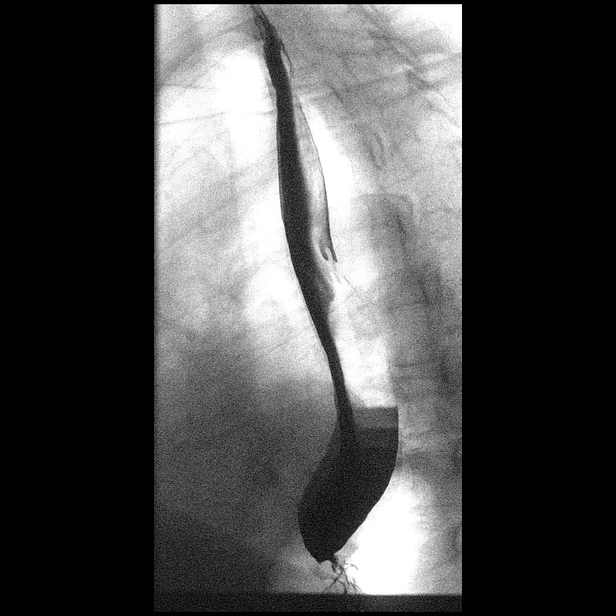

[Series 17: cp_standard · 0.18mm/px · 1 of 1 slices shown (10 of 14)]
[im 1/1]
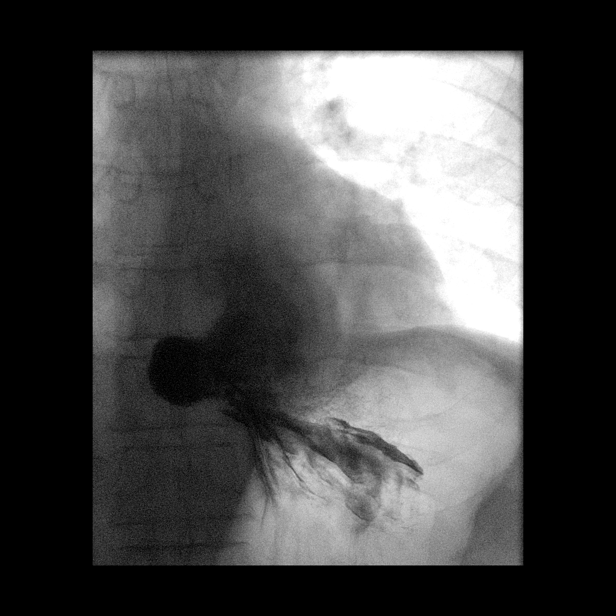

[Series 21: cp_standard · 0.18mm/px · 1 of 35 frames shown (11 of 14)]
[frame 30/35]
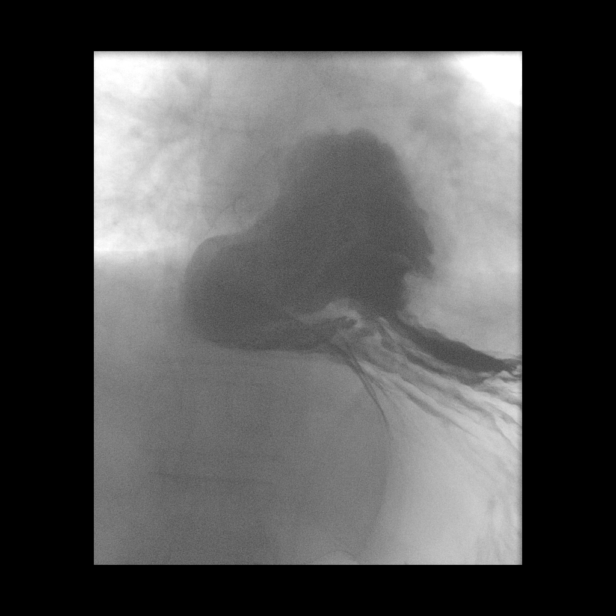

[Series 22: cp_standard · 0.18mm/px · 1 of 10 frames shown (12 of 14)]
[frame 2/10]
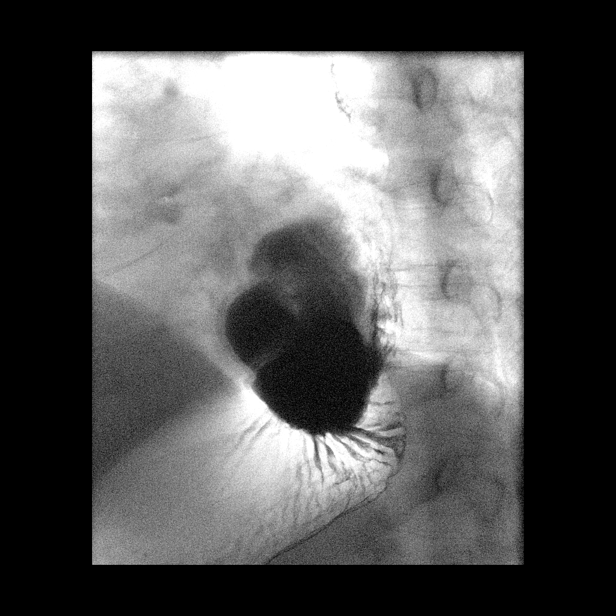

[Series 23: cp_standard · 0.18mm/px · 1 of 8 frames shown (13 of 14)]
[frame 7/8]
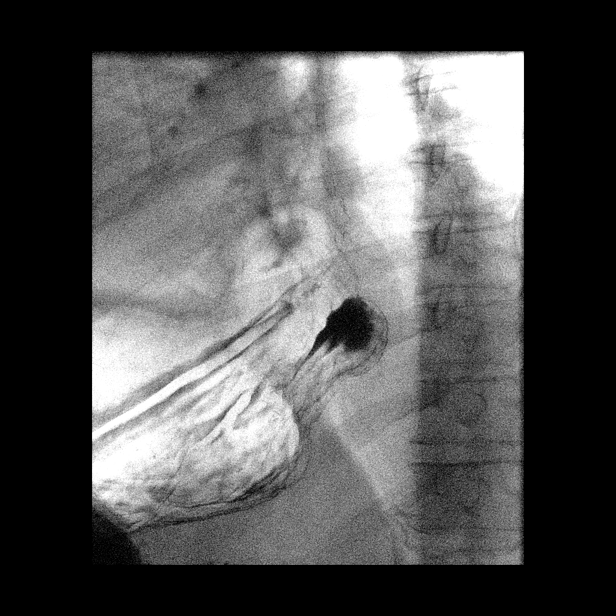

[Series 24: cp_standard · 0.18mm/px · 1 of 45 frames shown (14 of 14)]
[frame 45/45]
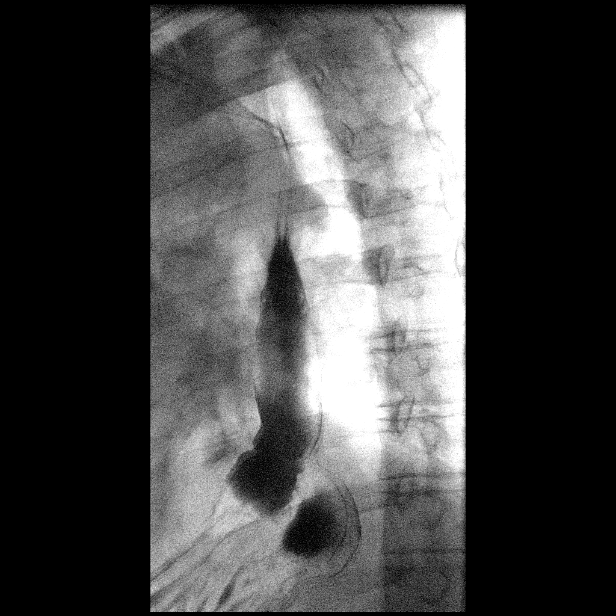

[14 of 24 positions shown; findings below may reference images not displayed]

FINDINGS: Thin barium administered in AP and lateral projections showing no
gross swallow dysfunction.

Effervescent crystals and thick barium was then administered showing
normal distensibility of the esophagus. Contrast flows readily into
a moderate to large hiatal hernia. Hernia with sliding-type and
paraesophageal components. EG junction above the level of the
esophageal hiatus. There is a small Schatzki's ring approximately 14
mm diameter. Barium tablet passes easily through this area.

Esophageal motility was normal.
IMPRESSION: 1. Moderate to large hiatal hernia with sliding-type and
paraesophageal components.
2. Small Schatzki's ring.
3. No signs of esophageal dysmotility.
